# Patient Record
Sex: Female | Born: 1985 | Race: Black or African American | Hispanic: No | Smoking: Never smoker
Health system: Southern US, Community
[De-identification: ages and names within clinical notes are randomized; demographics above are authoritative.]

## PROBLEM LIST (undated history)

## (undated) ENCOUNTER — Inpatient Hospital Stay (HOSPITAL_COMMUNITY): Payer: Self-pay

## (undated) DIAGNOSIS — Z789 Other specified health status: Secondary | ICD-10-CM

## (undated) HISTORY — PX: NO PAST SURGERIES: SHX2092

---

## 2017-01-22 ENCOUNTER — Other Ambulatory Visit: Payer: Self-pay

## 2017-01-22 ENCOUNTER — Emergency Department (HOSPITAL_BASED_OUTPATIENT_CLINIC_OR_DEPARTMENT_OTHER): Payer: Medicaid Other

## 2017-01-22 ENCOUNTER — Inpatient Hospital Stay (EMERGENCY_DEPARTMENT_HOSPITAL)
Admission: AD | Admit: 2017-01-22 | Discharge: 2017-01-22 | Disposition: A | Discharge status: Home / Self Care | Payer: Medicaid Other | Source: Ambulatory Visit | Attending: Obstetrics & Gynecology | Admitting: Obstetrics & Gynecology

## 2017-01-22 ENCOUNTER — Emergency Department (HOSPITAL_BASED_OUTPATIENT_CLINIC_OR_DEPARTMENT_OTHER)
Admission: EM | Admit: 2017-01-22 | Discharge: 2017-01-22 | Disposition: A | Discharge status: Home / Self Care | Payer: Medicaid Other | Attending: Emergency Medicine | Admitting: Emergency Medicine

## 2017-01-22 ENCOUNTER — Encounter (HOSPITAL_COMMUNITY): Payer: Self-pay | Admitting: *Deleted

## 2017-01-22 ENCOUNTER — Encounter (HOSPITAL_BASED_OUTPATIENT_CLINIC_OR_DEPARTMENT_OTHER): Payer: Self-pay

## 2017-01-22 DIAGNOSIS — O26899 Other specified pregnancy related conditions, unspecified trimester: Secondary | ICD-10-CM

## 2017-01-22 DIAGNOSIS — Z79899 Other long term (current) drug therapy: Secondary | ICD-10-CM | POA: Insufficient documentation

## 2017-01-22 DIAGNOSIS — R109 Unspecified abdominal pain: Secondary | ICD-10-CM

## 2017-01-22 DIAGNOSIS — O26851 Spotting complicating pregnancy, first trimester: Secondary | ICD-10-CM | POA: Diagnosis not present

## 2017-01-22 DIAGNOSIS — Z3A09 9 weeks gestation of pregnancy: Secondary | ICD-10-CM | POA: Diagnosis not present

## 2017-01-22 DIAGNOSIS — O418X10 Other specified disorders of amniotic fluid and membranes, first trimester, not applicable or unspecified: Secondary | ICD-10-CM

## 2017-01-22 DIAGNOSIS — O9989 Other specified diseases and conditions complicating pregnancy, childbirth and the puerperium: Secondary | ICD-10-CM | POA: Diagnosis present

## 2017-01-22 DIAGNOSIS — O209 Hemorrhage in early pregnancy, unspecified: Secondary | ICD-10-CM

## 2017-01-22 DIAGNOSIS — O210 Mild hyperemesis gravidarum: Secondary | ICD-10-CM

## 2017-01-22 DIAGNOSIS — O200 Threatened abortion: Secondary | ICD-10-CM

## 2017-01-22 DIAGNOSIS — R102 Pelvic and perineal pain: Secondary | ICD-10-CM | POA: Diagnosis not present

## 2017-01-22 DIAGNOSIS — O468X1 Other antepartum hemorrhage, first trimester: Secondary | ICD-10-CM

## 2017-01-22 HISTORY — DX: Other specified health status: Z78.9

## 2017-01-22 LAB — BASIC METABOLIC PANEL
Anion gap: 11 (ref 5–15)
BUN: 7 mg/dL (ref 6–20)
CALCIUM: 9.2 mg/dL (ref 8.9–10.3)
CHLORIDE: 103 mmol/L (ref 101–111)
CO2: 21 mmol/L — AB (ref 22–32)
CREATININE: 0.59 mg/dL (ref 0.44–1.00)
GFR calc Af Amer: >60 mL/min (ref >60)
GFR calc non Af Amer: >60 mL/min (ref >60)
GLUCOSE: 83 mg/dL (ref 65–99)
Potassium: 3.2 mmol/L — ABNORMAL LOW (ref 3.5–5.1)
Sodium: 135 mmol/L (ref 135–145)

## 2017-01-22 LAB — ABO/RH: ABO/RH(D): A POS

## 2017-01-22 LAB — URINALYSIS, ROUTINE W REFLEX MICROSCOPIC
BILIRUBIN URINE: NEGATIVE
GLUCOSE, UA: NEGATIVE mg/dL
LEUKOCYTES UA: NEGATIVE
Nitrite: NEGATIVE
PH: 7.5 (ref 5.0–8.0)
Protein, ur: NEGATIVE mg/dL
SPECIFIC GRAVITY, URINE: 1.015 (ref 1.005–1.030)

## 2017-01-22 LAB — CBC
HEMATOCRIT: 39.1 % (ref 36.0–46.0)
HEMOGLOBIN: 13.3 g/dL (ref 12.0–15.0)
MCH: 32.4 pg (ref 26.0–34.0)
MCHC: 34 g/dL (ref 30.0–36.0)
MCV: 95.1 fL (ref 78.0–100.0)
Platelets: 160 10*3/uL (ref 150–400)
RBC: 4.11 MIL/uL (ref 3.87–5.11)
RDW: 11.8 % (ref 11.5–15.5)
WBC: 7.9 10*3/uL (ref 4.0–10.5)

## 2017-01-22 LAB — WET PREP, GENITAL
Sperm: NONE SEEN
Trich, Wet Prep: NONE SEEN
YEAST WET PREP: NONE SEEN

## 2017-01-22 LAB — URINALYSIS, MICROSCOPIC (REFLEX)

## 2017-01-22 LAB — HCG, QUANTITATIVE, PREGNANCY: hCG, Beta Chain, Quant, S: 255964 m[IU]/mL — ABNORMAL HIGH (ref <5)

## 2017-01-22 MED ORDER — PROMETHAZINE HCL 25 MG PO TABS: 25.0000 mg | ORAL_TABLET | Freq: Four times a day (QID) | ORAL | 0 refills | Status: DC | PRN

## 2017-01-22 NOTE — MAU Provider Note (Signed)
History     CSN: [id number]  Arrival date and time: 01/22/17 1741  Seen by provider at 1845     Chief Complaint  Patient presents with  . Vaginal Bleeding   HPI Samantha Myers 31 y.o. 3089w3d Comes to MAU today with vaginal bleeding and passing one large clot at home.  Was very worried.  Was [facility] and had a full early pregnancy workup with ultrasound.  A subchorionic hemorrhage was seen on ultrasound.  Reviewed ultrasound, labs and note from earlier today.  Blood type is A positive.   OB History    Gravida Para Term Preterm AB Living   3 2 2  0 0 2   SAB TAB Ectopic Multiple Live Births   0 0 0 0 2      Past Medical History:  Diagnosis Date  . Medical history non-contributory     Past Surgical History:  Procedure Laterality Date  . NO PAST SURGERIES      History reviewed. No pertinent family history.  Social History   Tobacco Use  . Smoking status: Never Smoker  . Smokeless tobacco: Never Used  . Tobacco comment: tried cigarettes a long time ago, never smoked as a habit  Substance Use Topics  . Alcohol use: No    Frequency: Never  . Drug use: No    Allergies:  Allergies  Allergen Reactions  . Pertussis Vaccines     Medications Prior to Admission  Medication Sig Dispense Refill Last Dose  . Prenatal Multivit-Min-Fe-FA (PRENATAL VITAMINS PO) Take by mouth.       Review of Systems  Constitutional: Negative for fever.  Gastrointestinal: Positive for nausea and vomiting. Negative for abdominal pain.  Genitourinary: Positive for vaginal bleeding. Negative for dysuria and vaginal discharge.   Physical Exam   Blood pressure 123/74, pulse 99, temperature 98.8 F (37.1 C), temperature source Oral, resp. rate 16, last menstrual period 11/25/2016.  Physical Exam  Nursing note and vitals reviewed. Constitutional: She is oriented to person, place, and time. She appears well-developed and well-nourished.  HENT:  Head: Normocephalic.   Eyes: EOM are normal.  Neck: Neck supple.  GI: Soft. There is no tenderness. There is no rebound and no guarding.  Informal ultrasound at bedside by Estanislado SpireE. Lawrence, NP and baby visualized with flickering FHT - based on position of baby was not able to count FHT but visually was normal.  Musculoskeletal: Normal range of motion.  Neurological: She is alert and oriented to person, place, and time.  Skin: Skin is warm and dry.  Psychiatric: She has a normal mood and affect.    MAU Course  Procedures Bedside ultrasound - FHT identified - likely bleeding is coming from the earlier identified subchronic hemorrhage.  MDM Discussed with client that she may have additional vaginal bleeding.  Reviewed instructions for morning sickness and prescribed Phenergan for her to take at home.  Reviewed side effects of drowsiness.  Advised she can take 1/2 tablet.  Also gave instructions for using vitamin B6 and Unisom for nausea/  Client confirms no alcohol use, no smoking, and no medication or street drug use..  Assessment and Plan  Vaginal bleeding in first trimester - likely due to subchorionic hemorrhage but is possible from other causes - IUP identified on ultrasound. Morning sickness  Plan Get your medication from your pharmacy for nausea. Return if you have continued worsening bleeding with abdominal pain. Begin prenatal care - has a plan to see a provider  in January.  Has applied for Medicaid but does not yet have her card.  Samantha Myers 01/22/2017, 6:52 PM

## 2017-01-22 NOTE — ED Notes (Signed)
Patient has a small red blood clot appearing on a tissue in the trash can, notified RN Adrienne.

## 2017-01-22 NOTE — ED Provider Notes (Signed)
[facility] EMERGENCY DEPARTMENT Provider Note   CSN: [id number] Arrival date & time: 01/22/17  1058     History   Chief Complaint Chief Complaint  Patient presents with  . Vaginal Bleeding    HPI Samantha Myers is a 31 y.o. female who is G3P2002.  She is about [redacted] weeks pregnant at this time.  The patient had onset of spontaneous vaginal bleeding with clots and cramping about 5 minutes prior to arrival in the emergency department.  She denies fevers or chills.  She has no history of miscarriages.  HPI  History reviewed. No pertinent past medical history.  There are no active problems to display for this patient.   History reviewed. No pertinent surgical history.  OB History    Gravida Para Term Preterm AB Living   1             SAB TAB Ectopic Multiple Live Births                   Home Medications    Prior to Admission medications   Medication Sig Start Date End Date Taking? Authorizing Provider  Prenatal Multivit-Min-Fe-FA (PRENATAL VITAMINS PO) Take by mouth.   Yes [provider]    Family History No family history on file.  Social History Social History   Tobacco Use  . Smoking status: Never Smoker  . Smokeless tobacco: Never Used  Substance Use Topics  . Alcohol use: No    Frequency: Never  . Drug use: No     Allergies   Pertussis vaccines   Review of Systems Review of Systems  Ten systems reviewed and are negative for acute change, except as noted in the HPI.   Physical Exam Updated Vital Signs BP (!) 146/87 (BP Location: Right Arm)   Pulse 89   Temp 98.3 F (36.8 C) (Oral)   Resp 18   Ht 5\' 8"  (1.727 m)   Wt 75.8 kg (167 lb)   SpO2 100%   BMI 25.39 kg/m   Physical Exam  Physical Exam  Nursing note and vitals reviewed. Constitutional: She is oriented to person, place, and time. She appears well-developed and well-nourished. No distress.  HENT:  Head: Normocephalic and atraumatic.  Eyes: Conjunctivae  normal and EOM are normal. Pupils are equal, round, and reactive to light. No scleral icterus.  Neck: Normal range of motion.  Cardiovascular: Normal rate, regular rhythm and normal heart sounds.  Exam reveals no gallop and no friction rub.   No murmur heard. Pulmonary/Chest: Effort normal and breath sounds normal. No respiratory distress.  Abdominal: Soft. Bowel sounds are normal. She exhibits no distension and no mass. There is no tenderness. There is no guarding.  Neurological: She is alert and oriented to person, place, and time.  GU: Normal external female genitalia.  Blood noted in the vaginal vault.  Cervical mucus noted and blood from the cervical office without clotting.  Diffuse moderate tenderness on examination. Skin: Skin is warm and dry. She is not diaphoretic.    ED Treatments / Results  Labs (all labs ordered are listed, but only abnormal results are displayed) Labs Reviewed  BASIC METABOLIC PANEL - Abnormal; Notable for the following components:      Result Value   Potassium 3.2 (*)    CO2 21 (*)    All other components within normal limits  URINALYSIS, ROUTINE W REFLEX MICROSCOPIC - Abnormal; Notable for the following components:   Hgb urine dipstick LARGE (*)  Ketones, ur >80 (*)    All other components within normal limits  URINALYSIS, MICROSCOPIC (REFLEX) - Abnormal; Notable for the following components:   Bacteria, UA RARE (*)    Squamous Epithelial / LPF 0-5 (*)    All other components within normal limits  WET PREP, GENITAL  CBC  HCG, QUANTITATIVE, PREGNANCY  ABO/RH  GC/CHLAMYDIA PROBE AMP ([place]) NOT AT [facility]    EKG  EKG Interpretation None       Radiology No results found.  Procedures Procedures (including critical care time)  Medications Ordered in ED Medications - No data to display   Initial Impression / Assessment and Plan / ED Course  I have reviewed the triage vital signs and the nursing notes.  Pertinent labs & imaging  results that were available during my care of the patient were reviewed by me and considered in my medical decision making (see chart for details).      single IUP visualized in the uterus. Her HCG is tracking with expected EGA. The patient also has subchorionic hemorrhage.  Will d/c with dx of threatened miscarriage and close ob follow up .   Final Clinical Impressions(s) / ED Diagnoses   Final diagnoses:  Subchorionic hematoma in first trimester, single or unspecified fetus  Threatened miscarriage    ED Discharge Orders    None       Arthor CaptainHarris, Emanuel Dowson, PA-C 01/22/17 1623    Raeford RazorKohut, Stephen, MD 01/23/17 [phone]

## 2017-01-22 NOTE — ED Triage Notes (Signed)
Pt states she is [redacted] weeks pregnant by US-vaginal bleeding started just PTA-NAD-presents to triage in w/c

## 2017-01-22 NOTE — Discharge Instructions (Signed)
Get help right away if: You have severe cramps in your stomach, back, abdomen, or pelvis. You have a fever. You pass large clots or tissue. Save any tissue for your health care provider to look at. Your bleeding increases or you become lightheaded, feel weak, or have fainting episodes.

## 2017-01-22 NOTE — Progress Notes (Signed)
Informal bedside ultrasound performed. IUP @ 8w with cardiac activity.   Judeth HornLawrence, Luz Mares, NP

## 2017-01-22 NOTE — MAU Note (Signed)
Pt seen earlier today @ [facility], [facility]  Pt states she passed a large clot, was instructed to come here with further issues.  Denies pain.

## 2017-01-22 NOTE — Discharge Instructions (Signed)
Get your medicine at your pharmacy. Use Vitamin B6 50 mg tablets and Unisom (Plain) at bedtime. Ask your provider for Diclegis when you get your Medicaid card.

## 2017-01-23 LAB — GC/CHLAMYDIA PROBE AMP (~~LOC~~) NOT AT ARMC
CHLAMYDIA, DNA PROBE: NEGATIVE
NEISSERIA GONORRHEA: NEGATIVE

## 2017-02-10 ENCOUNTER — Ambulatory Visit (INDEPENDENT_AMBULATORY_CARE_PROVIDER_SITE_OTHER): Payer: Medicaid Other | Admitting: Obstetrics and Gynecology

## 2017-02-10 ENCOUNTER — Encounter: Payer: Self-pay | Admitting: Obstetrics and Gynecology

## 2017-02-10 ENCOUNTER — Other Ambulatory Visit (HOSPITAL_COMMUNITY)
Admission: RE | Admit: 2017-02-10 | Discharge: 2017-02-10 | Disposition: A | Discharge status: Home / Self Care | Payer: Medicaid Other | Source: Ambulatory Visit | Attending: Obstetrics and Gynecology | Admitting: Obstetrics and Gynecology

## 2017-02-10 VITALS — BP 119/72 | HR 77 | Wt 174.0 lb

## 2017-02-10 DIAGNOSIS — Z3481 Encounter for supervision of other normal pregnancy, first trimester: Secondary | ICD-10-CM | POA: Diagnosis not present

## 2017-02-10 DIAGNOSIS — N83292 Other ovarian cyst, left side: Secondary | ICD-10-CM

## 2017-02-10 DIAGNOSIS — Z3490 Encounter for supervision of normal pregnancy, unspecified, unspecified trimester: Secondary | ICD-10-CM

## 2017-02-10 DIAGNOSIS — Z3401 Encounter for supervision of normal first pregnancy, first trimester: Secondary | ICD-10-CM

## 2017-02-10 DIAGNOSIS — O200 Threatened abortion: Secondary | ICD-10-CM

## 2017-02-10 DIAGNOSIS — Z3493 Encounter for supervision of normal pregnancy, unspecified, third trimester: Secondary | ICD-10-CM | POA: Insufficient documentation

## 2017-02-10 NOTE — Progress Notes (Addendum)
New OB Note  02/10/2017   [facility]  Chief Complaint: NOB  Transfer of Care Patient: no  History of Present Illness: Ms. Samantha Myers is a 32 y.o. [id number] @ 12/2 weeks (EDC 7/27, based on 8wk u/s). Patient's last menstrual period was 11/25/2016 (exact date).  Preg complicated by has Threatened abortion in first trimester and Supervision of normal pregnancy in first trimester on their problem list.   Any events prior to today's visit: yes:  MAU and ED visits for VB Her periods were: qmonth and regular She was using no method when she conceived.   Just having spotting currently, last episode this morning  ROS: A 12-point review of systems was performed and negative, except as stated in the above HPI.  OBGYN History: As per HPI. OB History  Gravida Para Term Preterm AB Living  3 2 2  0 0 2  SAB TAB Ectopic Multiple Live Births  0 0 0 0 2    # Outcome Date GA Lbr Len/2nd Weight Sex Delivery Anes PTL Lv  3 Current           2 Term      Vag-Spont     1 Term      Vag-Spont       Obstetric Comments  Last child in 2007. Largest prior 8.5 lbs    Any issues with any prior pregnancies: no Prior children are healthy, doing well, and without any problems or issues: yes History of pap smears: Yes. Last pap smear 2017 and results were NILM/HPV neg   Past Medical History: Past Medical History:  Diagnosis Date  . Medical history non-contributory     Past Surgical History: Past Surgical History:  Procedure Laterality Date  . NO PAST SURGERIES      Family History:  Family History  Problem Relation Age of Onset  . Cancer Maternal Grandmother        breast  . Diabetes Maternal Grandfather   . Hypertension Paternal Grandmother   . Hypertension Paternal Grandfather   . Stroke Neg Hx    She denies any history of mental retardation, birth defects or genetic disorders in her or the FOB's history  Social History:  Social History   Socioeconomic History  .  Marital status: Single    Spouse name: Not on file  . Number of children: Not on file  . Years of education: Not on file  . Highest education level: Not on file  Social Needs  . Financial resource strain: Not on file  . Food insecurity - worry: Not on file  . Food insecurity - inability: Not on file  . Transportation needs - medical: Not on file  . Transportation needs - non-medical: Not on file  Occupational History  . Not on file  Tobacco Use  . Smoking status: Never Smoker  . Smokeless tobacco: Never Used  . Tobacco comment: tried cigarettes a long time ago, never smoked as a habit  Substance and Sexual Activity  . Alcohol use: No    Frequency: Never  . Drug use: No  . Sexual activity: Yes    Partners: Male    Birth control/protection: None  Other Topics Concern  . Not on file  Social History Narrative  . Not on file    Allergy: Allergies  Allergen Reactions  . Pertussis Vaccines     Health Maintenance:  Mammogram Up to Date: yes, 2017 bi rads 1  Current Outpatient Medications: PNV  Physical Exam:  BP 119/72   Pulse 77   Wt 174 lb (78.9 kg)   LMP 11/25/2016 (Exact Date)   BMI 26.46 kg/m  Body mass index is 26.46 kg/m. Contractions: Not present Vag. Bleeding: Scant. Fundal height: not applicable FHTs: 150s  General appearance: Well nourished, well developed female in no acute distress.  Neck:  Supple, normal appearance, and no thyromegaly  Cardiovascular: S1, S2 normal, no murmur, rub or gallop, regular rate and rhythm Respiratory:  Clear to auscultation bilateral. Normal respiratory effort Abdomen: positive bowel sounds and no masses, hernias; diffusely non tender to palpation, non distended Breasts: breasts appear normal, no suspicious masses, no skin or nipple changes or axillary nodes, and negative palpation. Neuro/Psych:  Normal mood and affect.  Skin:  Warm and dry.  Lymphatic:  No inguinal lymphadenopathy.   Pelvic exam: is not limited by  body habitus EGBUS: within normal limits, Vagina: within normal limits and with scant blood tinged mucus in the vault,  Cervix: normal appearing cervix with scant blood tinged mucus at the os, no active bleeding, closed/long/high, Uterus:  enlarged, c/w 12 week size, and Adnexa:  normal adnexa and no mass, fullness, tenderness  Laboratory: Negative December 2018 wet prep, cbc, gc/ct A POS  Imaging:  01/12/17 OB TRANSVAGINAL ULTRASOUND:  TECHNIQUE: Grayscale and Doppler images obtained using a transvaginal approach.   PROVIDED CLINICAL INFORMATION: vaginal bleed pregnant [redacted] weeks approximately ADDITIONAL CLINICAL INFORMATION: None available  COMPARISON: None  INTERPRETATION:   The uterus is normal in size and echotexture.  Number gestational sacs: 1 Number of fetuses: 1 Fetal heart rate: 141 bpm  Gestational sac size: 3.04 cm, consistent with a sonographic gestational age of [redacted] weeks and 1 day.  Crown rump length: 1.13 cm, consistent with a sonographic gestational age of [redacted] weeks and 2 days. Fetal size and anatomy: Unremarkable early gestation. A yolk sac is present.  Right ovary is normal in size and appearance. Left ovary is normal in size and appearance. It contains a mildly complex cyst with internal nonvascular septation measuring 3.5 x 2.5 x 3.8 cm.  There is no significant free fluid.  IMPRESSION: Single live intrauterine gestation with average sonographic gestational age of [redacted] weeks and 5 days.  Mildly complex left ovarian cyst.  Electronically Signed by: Chase Caller, MD   Assessment: pt stable  Plan: 1. Prenatal care, antepartum Routine care. Pt amenable to genetics. D/w pt re: edc change based on [facility] u/s - Obstetric Panel, Including HIV - GC/Chlamydia probe amp ([place])not at [facility] MFM Fetal Nuchal Translucency; Future - Hemoglobinopathy Evaluation - Cystic fibrosis gene test - SMN1 Copy Number Analysis  2. Threatened abortion in first  trimester Rh pos. Likely due to [facility]. Pt just endorses spotting. Recommend if having only spotting to let us know and come in for [facility] check qwk. ER precautions given. Pt amenable to genetic screening   Problem list reviewed and updated.  Follow up in 3 weeks.  The nature of [facility] with multiple MDs and other Advanced Practice Providers was explained to patient; also emphasized that residents, students are part of our team.  >50% of 20 min visit spent on counseling and coordination of care.     Cornelia Copa MD Attending Center for [facility])

## 2017-02-10 NOTE — Progress Notes (Signed)
Patient reports that she was seen at the hospital and has subchronic. Armandina StammerJennifer Howard RNBSN DATING AND VIABILITY SONOGRAM   Samantha CoryWhitney Fisk is a 32 y.o. year old 163P2002 with LMP Patient's last menstrual period was 11/25/2016 (exact date). which would correlate to  4632w2d weeks gestation.  She has regular menstrual cycles.   She is here today for a confirmatory initial sonogram.    GESTATION: SINGLETON   FETAL ACTIVITY:          Heart rate         158          The fetus is active.    GESTATIONAL AGE AND  BIOMETRICS:  Gestational criteria: Estimated Date of Delivery: 08/23/17 by early ultrasound now at 6332w2d  Previous Scans:2      CROWN RUMP LENGTH                   11.6 weeks                                                                               AVERAGE EGA(BY THIS SCAN):  11.6 weeks  WORKING EDD( early ultrasound ):  08-23-17     Armandina StammerJennifer Howard 02/10/2017 10:47 AM

## 2017-02-11 ENCOUNTER — Encounter (HOSPITAL_COMMUNITY): Payer: Self-pay | Admitting: Obstetrics and Gynecology

## 2017-02-11 LAB — OBSTETRIC PANEL, INCLUDING HIV
Antibody Screen: NEGATIVE
BASOS ABS: 0 10*3/uL (ref 0.0–0.2)
Basos: 0 %
EOS (ABSOLUTE): 0.1 10*3/uL (ref 0.0–0.4)
EOS: 1 %
HEMOGLOBIN: 12 g/dL (ref 11.1–15.9)
HEP B S AG: NEGATIVE
HIV Screen 4th Generation wRfx: NONREACTIVE
Hematocrit: 36.9 % (ref 34.0–46.6)
IMMATURE GRANS (ABS): 0 10*3/uL (ref 0.0–0.1)
IMMATURE GRANULOCYTES: 0 %
LYMPHS: 22 %
Lymphocytes Absolute: 1.7 10*3/uL (ref 0.7–3.1)
MCH: 31.6 pg (ref 26.6–33.0)
MCHC: 32.5 g/dL (ref 31.5–35.7)
MCV: 97 fL (ref 79–97)
MONOCYTES: 7 %
Monocytes Absolute: 0.6 10*3/uL (ref 0.1–0.9)
NEUTROS PCT: 70 %
Neutrophils Absolute: 5.6 10*3/uL (ref 1.4–7.0)
PLATELETS: 194 10*3/uL (ref 150–379)
RBC: 3.8 x10E6/uL (ref 3.77–5.28)
RDW: 13 % (ref 12.3–15.4)
RH TYPE: POSITIVE
RPR: NONREACTIVE
Rubella Antibodies, IGG: 1 index (ref >0.99)
WBC: 7.9 10*3/uL (ref 3.4–10.8)

## 2017-02-11 LAB — GC/CHLAMYDIA PROBE AMP (~~LOC~~) NOT AT ARMC
Chlamydia: NEGATIVE
Neisseria Gonorrhea: NEGATIVE

## 2017-02-17 ENCOUNTER — Other Ambulatory Visit (INDEPENDENT_AMBULATORY_CARE_PROVIDER_SITE_OTHER): Payer: Medicaid Other

## 2017-02-17 DIAGNOSIS — O26851 Spotting complicating pregnancy, first trimester: Secondary | ICD-10-CM

## 2017-02-17 LAB — HEMOGLOBINOPATHY EVALUATION
Ferritin: 89 ng/mL (ref 15–150)
HEMATOCRIT: 37.1 % (ref 34.0–46.6)
HEMOGLOBIN: 12.3 g/dL (ref 11.1–15.9)
HGB C: 0 %
HGB S: 0 %
HGB VARIANT: 0 %
Hgb A2 Quant: 2.6 % (ref 1.8–3.2)
Hgb A: 97.4 % (ref 96.4–98.8)
Hgb F Quant: 0 % (ref 0.0–2.0)
Hgb Solubility: NEGATIVE
MCH: 32.1 pg (ref 26.6–33.0)
MCHC: 33.2 g/dL (ref 31.5–35.7)
MCV: 97 fL (ref 79–97)
Platelets: 198 10*3/uL (ref 150–379)
RBC: 3.83 x10E6/uL (ref 3.77–5.28)
RDW: 13 % (ref 12.3–15.4)
WBC: 7.8 10*3/uL (ref 3.4–10.8)

## 2017-02-17 LAB — CYSTIC FIBROSIS GENE TEST

## 2017-02-17 LAB — SMN1 COPY NUMBER ANALYSIS (SMA CARRIER SCREENING)

## 2017-02-17 NOTE — Progress Notes (Signed)
Patient presents for fetal check per Dr. Vergie LivingPickens.  Patient states that she has still had a "little spotting;maybe three days of the week".  Bedside ultrasound performed for fetal heart. Fetal heart rate 150 bpm and fetus is active. Reassuring to the patient. Patient is scheduled for nuchal translucency test tomorrow 02-18-17. Armandina StammerJennifer Nevyn Bossman RNBSN

## 2017-02-18 ENCOUNTER — Ambulatory Visit (HOSPITAL_COMMUNITY)
Admission: RE | Admit: 2017-02-18 | Discharge: 2017-02-18 | Disposition: A | Discharge status: Home / Self Care | Payer: Medicaid Other | Source: Ambulatory Visit | Attending: Obstetrics and Gynecology | Admitting: Obstetrics and Gynecology

## 2017-02-18 ENCOUNTER — Encounter (HOSPITAL_COMMUNITY): Payer: Self-pay

## 2017-02-18 ENCOUNTER — Other Ambulatory Visit: Payer: Self-pay | Admitting: Obstetrics and Gynecology

## 2017-02-18 DIAGNOSIS — Z3A13 13 weeks gestation of pregnancy: Secondary | ICD-10-CM | POA: Insufficient documentation

## 2017-02-18 DIAGNOSIS — Z3682 Encounter for antenatal screening for nuchal translucency: Secondary | ICD-10-CM | POA: Diagnosis present

## 2017-02-18 DIAGNOSIS — Z3490 Encounter for supervision of normal pregnancy, unspecified, unspecified trimester: Secondary | ICD-10-CM

## 2017-02-28 ENCOUNTER — Other Ambulatory Visit (HOSPITAL_COMMUNITY): Payer: Self-pay

## 2017-03-04 ENCOUNTER — Ambulatory Visit (INDEPENDENT_AMBULATORY_CARE_PROVIDER_SITE_OTHER): Payer: Medicaid Other | Admitting: Advanced Practice Midwife

## 2017-03-04 VITALS — BP 123/68 | HR 91 | Wt 177.0 lb

## 2017-03-04 DIAGNOSIS — Z363 Encounter for antenatal screening for malformations: Secondary | ICD-10-CM

## 2017-03-04 DIAGNOSIS — Z3481 Encounter for supervision of other normal pregnancy, first trimester: Secondary | ICD-10-CM

## 2017-03-04 DIAGNOSIS — Z3A18 18 weeks gestation of pregnancy: Secondary | ICD-10-CM

## 2017-03-04 DIAGNOSIS — Z3490 Encounter for supervision of normal pregnancy, unspecified, unspecified trimester: Secondary | ICD-10-CM

## 2017-03-04 NOTE — Patient Instructions (Signed)
Pregnancy and Influenza Influenza, also called the flu, is an infection of the respiratory tract. If you are pregnant, you are more likely to catch the flu. You are also more likely to have a more serious case of the flu. This is because pregnancy lowers your body's ability to fight off infections (it weakens your immune system). It also puts additional stress on your heart and lungs, which makes you more likely to have complications. Having a bad case of the flu, especially with a high fever, can be dangerous for your developing baby. It can cause you to go into early labor. How do people get the flu? The flu is caused by the influenza virus. This virus is common every year in the fall and winter. It spreads when virus particles get passed from person to person. You can get the virus if you are near a sick person who is coughing or sneezing. You can also get the virus if you touch something that has the virus on it and then touch your face. How can I protect myself against the flu?  Get a flu shot. The best way to prevent the flu is to get a flu shot before flu season starts. The flu shot is not dangerous for your developing baby. It may even help protect your baby from the flu for up to 6 months after birth. The flu shot is one type of flu vaccine. Another type is a nasal spray vaccine. Do not get the nasal spray vaccine. It is not approved for pregnancy.  Do not come in close contact with sick people.  Do not share food, drinks, or utensils with other people.  Wash your hands often. Use hand sanitizer when soap and water are not available. What should I do if I have flu symptoms? If you have any flu symptoms, call your health care provider right away. Flu symptoms include:  Fever or chills.  Muscle aches.  Headache.  Sore throat.  Nasal congestion.  Cough.  Feeling tired.  Loss of appetite.  Vomiting.  Diarrhea.  You may be able to take an antiviral medicine to keep the flu  from becoming severe and to shorten how long it lasts. What should I do at home if I am diagnosed with the flu?  Do not take any medicine, including cold or flu medicine, unless directed by your health care provider.  If you take antiviral medicine, make sure you finish it even if you start to feel better.  Drink enough fluid to keep your urine clear or pale yellow.  Get plenty of rest. When would I seek immediate medical care if I have the flu?  You have trouble breathing.  You have chest pain.  You begin to have labor pains.  You have a high fever that does not go down after you take medicine.  You do not feel your baby move.  You have diarrhea or vomiting that will not go away. This information is not intended to replace advice given to you by your health care provider. Make sure you discuss any questions you have with your health care provider. Document Released: 11/17/2007 Document Revised: 06/22/2015 Document Reviewed: 12/11/2012 Elsevier Interactive Patient Education  2017 [employer]   Second Trimester of Pregnancy The second trimester is from week 13 through week 28, month 4 through 6. This is often the time in pregnancy that you feel your best. Often times, morning sickness has lessened or quit. You may have more energy, and you  may get hungry more often. Your unborn baby (fetus) is growing rapidly. At the end of the sixth month, he or she is about 9 inches long and weighs about 1 pounds. You will likely feel the baby move (quickening) between 18 and 20 weeks of pregnancy. Follow these instructions at home:  Avoid all smoking, herbs, and alcohol. Avoid drugs not approved by your doctor.  Do not use any tobacco products, including cigarettes, chewing tobacco, and electronic cigarettes. If you need help quitting, ask your doctor. You may get counseling or other support to help you quit.  Only take medicine as told by your doctor. Some medicines are safe and some are  not during pregnancy.  Exercise only as told by your doctor. Stop exercising if you start having cramps.  Eat regular, healthy meals.  Wear a good support bra if your breasts are tender.  Do not use hot tubs, steam rooms, or saunas.  Wear your seat belt when driving.  Avoid raw meat, uncooked cheese, and liter boxes and soil used by cats.  Take your prenatal vitamins.  Take 1500-2000 milligrams of calcium daily starting at the 20th week of pregnancy until you deliver your baby.  Try taking medicine that helps you poop (stool softener) as needed, and if your doctor approves. Eat more fiber by eating fresh fruit, vegetables, and whole grains. Drink enough fluids to keep your pee (urine) clear or pale yellow.  Take warm water baths (sitz baths) to soothe pain or discomfort caused by hemorrhoids. Use hemorrhoid cream if your doctor approves.  If you have puffy, bulging veins (varicose veins), wear support hose. Raise (elevate) your feet for 15 minutes, 3-4 times a day. Limit salt in your diet.  Avoid heavy lifting, wear low heals, and sit up straight.  Rest with your legs raised if you have leg cramps or low back pain.  Visit your dentist if you have not gone during your pregnancy. Use a soft toothbrush to brush your teeth. Be gentle when you floss.  You can have sex (intercourse) unless your doctor tells you not to.  Go to your doctor visits. Get help if:  You feel dizzy.  You have mild cramps or pressure in your lower belly (abdomen).  You have a nagging pain in your belly area.  You continue to feel sick to your stomach (nauseous), throw up (vomit), or have watery poop (diarrhea).  You have bad smelling fluid coming from your vagina.  You have pain with peeing (urination). Get help right away if:  You have a fever.  You are leaking fluid from your vagina.  You have spotting or bleeding from your vagina.  You have severe belly cramping or pain.  You lose or gain  weight rapidly.  You have trouble catching your breath and have chest pain.  You notice sudden or extreme puffiness (swelling) of your face, hands, ankles, feet, or legs.  You have not felt the baby move in over an hour.  You have severe headaches that do not go away with medicine.  You have vision changes. This information is not intended to replace advice given to you by your health care provider. Make sure you discuss any questions you have with your health care provider. Document Released: 04/10/2009 Document Revised: 06/22/2015 Document Reviewed: 03/17/2012 Elsevier Interactive Patient Education  2017 [employer].

## 2017-03-04 NOTE — Progress Notes (Signed)
   PRENATAL VISIT NOTE  Subjective:  Samantha Myers is a 32 y.o. G3P2002 at 5152w3d being seen today for ongoing prenatal care.  She is currently monitored for the following issues for this low-risk pregnancy and has Threatened abortion in first trimester and Supervision of normal pregnancy in first trimester on their problem list.  Patient reports no complaints and no more bleeding.  Contractions: Not present. Vag. Bleeding: None.   . Denies leaking of fluid.   The following portions of the patient's history were reviewed and updated as appropriate: allergies, current medications, past family history, past medical history, past social history, past surgical history and problem list. Problem list updated.  Objective:   Vitals:   03/04/17 0941  BP: 123/68  Pulse: 91  Weight: 177 lb (80.3 kg)    Fetal Status: Fetal Heart Rate (bpm): 145         General:  Alert, oriented and cooperative. Patient is in no acute distress.  Skin: Skin is warm and dry. No rash noted.   Cardiovascular: Normal heart rate noted  Respiratory: Normal respiratory effort, no problems with respiration noted  Abdomen: Soft, gravid, appropriate for gestational age.  Pain/Pressure: Absent     Pelvic: Cervical exam deferred        Extremities: Normal range of motion.  Edema: None  Mental Status:  Normal mood and affect. Normal behavior. Normal judgment and thought content.   Assessment and Plan:  Pregnancy: G3P2002 at 352w3d  1. Prenatal care, antepartum  - Refused Flu vaccine. Handout given.  - US MFM OB DETAIL +14 WK; Future  2. Encounter for supervision of other normal pregnancy in first trimester  - US MFM OB COMP + 14 WK; Future  3. Encounter for antenatal screening for malformation  - US MFM OB COMP + 14 WK; Future  4. [redacted] weeks gestation of pregnancy  - US MFM OB COMP + 14 WK; Future  Preterm labor symptoms and general obstetric precautions including but not limited to vaginal bleeding,  contractions, leaking of fluid and fetal movement were reviewed in detail with the patient. Please refer to After Visit Summary for other counseling recommendations.  Return in about 4 weeks (around 04/01/2017) for ROB.   Dorathy KinsmanVirginia Fuquan Wilson, CNM

## 2017-03-10 ENCOUNTER — Other Ambulatory Visit: Payer: Self-pay

## 2017-03-10 ENCOUNTER — Inpatient Hospital Stay (HOSPITAL_COMMUNITY)
Admission: AD | Admit: 2017-03-10 | Discharge: 2017-03-10 | Disposition: A | Discharge status: Home / Self Care | Payer: Medicaid Other | Source: Ambulatory Visit | Attending: Obstetrics & Gynecology | Admitting: Obstetrics & Gynecology

## 2017-03-10 ENCOUNTER — Telehealth: Payer: Self-pay

## 2017-03-10 ENCOUNTER — Encounter (HOSPITAL_COMMUNITY): Payer: Self-pay | Admitting: *Deleted

## 2017-03-10 DIAGNOSIS — Z3A16 16 weeks gestation of pregnancy: Secondary | ICD-10-CM | POA: Diagnosis not present

## 2017-03-10 DIAGNOSIS — Z79899 Other long term (current) drug therapy: Secondary | ICD-10-CM | POA: Diagnosis not present

## 2017-03-10 DIAGNOSIS — Z833 Family history of diabetes mellitus: Secondary | ICD-10-CM | POA: Insufficient documentation

## 2017-03-10 DIAGNOSIS — R1031 Right lower quadrant pain: Secondary | ICD-10-CM | POA: Diagnosis not present

## 2017-03-10 DIAGNOSIS — Z8249 Family history of ischemic heart disease and other diseases of the circulatory system: Secondary | ICD-10-CM | POA: Insufficient documentation

## 2017-03-10 DIAGNOSIS — R102 Pelvic and perineal pain: Secondary | ICD-10-CM

## 2017-03-10 DIAGNOSIS — O26892 Other specified pregnancy related conditions, second trimester: Secondary | ICD-10-CM | POA: Insufficient documentation

## 2017-03-10 DIAGNOSIS — O219 Vomiting of pregnancy, unspecified: Secondary | ICD-10-CM | POA: Diagnosis not present

## 2017-03-10 DIAGNOSIS — R109 Unspecified abdominal pain: Secondary | ICD-10-CM

## 2017-03-10 DIAGNOSIS — Z888 Allergy status to other drugs, medicaments and biological substances status: Secondary | ICD-10-CM | POA: Diagnosis not present

## 2017-03-10 DIAGNOSIS — O26899 Other specified pregnancy related conditions, unspecified trimester: Secondary | ICD-10-CM

## 2017-03-10 DIAGNOSIS — Z803 Family history of malignant neoplasm of breast: Secondary | ICD-10-CM | POA: Diagnosis not present

## 2017-03-10 DIAGNOSIS — O212 Late vomiting of pregnancy: Secondary | ICD-10-CM | POA: Diagnosis not present

## 2017-03-10 DIAGNOSIS — R1030 Lower abdominal pain, unspecified: Secondary | ICD-10-CM | POA: Insufficient documentation

## 2017-03-10 LAB — URINALYSIS, ROUTINE W REFLEX MICROSCOPIC
Bacteria, UA: NONE SEEN
Bilirubin Urine: NEGATIVE
GLUCOSE, UA: NEGATIVE mg/dL
KETONES UR: 80 mg/dL — AB
Leukocytes, UA: NEGATIVE
NITRITE: NEGATIVE
PH: 6 (ref 5.0–8.0)
Protein, ur: 30 mg/dL — AB
SPECIFIC GRAVITY, URINE: 1.023 (ref 1.005–1.030)

## 2017-03-10 LAB — CBC WITH DIFFERENTIAL/PLATELET
BASOS PCT: 0 %
Basophils Absolute: 0 10*3/uL (ref 0.0–0.1)
Eosinophils Absolute: 0.1 10*3/uL (ref 0.0–0.7)
Eosinophils Relative: 1 %
HEMATOCRIT: 34.3 % — AB (ref 36.0–46.0)
HEMOGLOBIN: 11.8 g/dL — AB (ref 12.0–15.0)
LYMPHS ABS: 2.1 10*3/uL (ref 0.7–4.0)
LYMPHS PCT: 24 %
MCH: 32.4 pg (ref 26.0–34.0)
MCHC: 34.4 g/dL (ref 30.0–36.0)
MCV: 94.2 fL (ref 78.0–100.0)
MONOS PCT: 7 %
Monocytes Absolute: 0.6 10*3/uL (ref 0.1–1.0)
NEUTROS ABS: 5.7 10*3/uL (ref 1.7–7.7)
Neutrophils Relative %: 68 %
Platelets: 143 10*3/uL — ABNORMAL LOW (ref 150–400)
RBC: 3.64 MIL/uL — ABNORMAL LOW (ref 3.87–5.11)
RDW: 12.7 % (ref 11.5–15.5)
WBC: 8.4 10*3/uL (ref 4.0–10.5)

## 2017-03-10 MED ORDER — LACTATED RINGERS IV BOLUS (SEPSIS)
1000.0000 mL | Freq: Once | INTRAVENOUS | Status: AC
  Administered 2017-03-10: 1000 mL via INTRAVENOUS

## 2017-03-10 MED ORDER — ONDANSETRON HCL 4 MG PO TABS: 4.0000 mg | ORAL_TABLET | Freq: Three times a day (TID) | ORAL | 2 refills | Status: DC | PRN

## 2017-03-10 MED ORDER — DEXTROSE 5 % IN LACTATED RINGERS IV BOLUS
1000.0000 mL | Freq: Once | INTRAVENOUS | Status: AC
  Administered 2017-03-10: 1000 mL via INTRAVENOUS

## 2017-03-10 MED ORDER — FAMOTIDINE IN NACL 20-0.9 MG/50ML-% IV SOLN
20.0000 mg | Freq: Once | INTRAVENOUS | Status: AC
  Administered 2017-03-10: 20 mg via INTRAVENOUS
  Filled 2017-03-10: qty 50

## 2017-03-10 MED ORDER — SODIUM CHLORIDE 0.9 % IV SOLN
8.0000 mg | Freq: Once | INTRAVENOUS | Status: AC
  Administered 2017-03-10: 8 mg via INTRAVENOUS
  Filled 2017-03-10: qty 4

## 2017-03-10 NOTE — MAU Provider Note (Signed)
History     CSN: [id number]  Arrival date and time: 03/10/17 [id number]   First Provider Initiated Contact with Patient 03/10/17 1937      Chief Complaint  Patient presents with  . Abdominal Pain   HPI   [name] is a 32 y.o. female 723P2002 @ 8944w2d here with bilateral lower abdominal pain. The pain started yesterday. States she had 2 episodes of vomiting yesterday, none today. She did not take any nausea medication. States she has had N/V throughout the pregnancy. Says it is common for her to have nausea and vomiting everyday.  Says she has had little to eat or drink today. States the abdominal pain comes and goes and is located throughout her lower abdomen.  No bleeding. States the pain in her belly worsens with movement.   OB History    Gravida Para Term Preterm AB Living   3 2 2  0 0 2   SAB TAB Ectopic Multiple Live Births   0 0 0 0 2      Obstetric Comments   Last child in 2007. Largest prior 8.5 lbs      Past Medical History:  Diagnosis Date  . Medical history non-contributory     Past Surgical History:  Procedure Laterality Date  . NO PAST SURGERIES      Family History  Problem Relation Age of Onset  . Cancer Maternal Grandmother        breast  . Diabetes Maternal Grandfather   . Hypertension Paternal Grandmother   . Hypertension Paternal Grandfather   . Stroke Neg Hx     Social History   Tobacco Use  . Smoking status: Never Smoker  . Smokeless tobacco: Never Used  . Tobacco comment: tried cigarettes a long time ago, never smoked as a habit  Substance Use Topics  . Alcohol use: No    Frequency: Never  . Drug use: No    Allergies:  Allergies  Allergen Reactions  . Pertussis Vaccines     Medications Prior to Admission  Medication Sig Dispense Refill Last Dose  . Prenatal Multivit-Min-Fe-FA (PRENATAL VITAMINS PO) Take by mouth.   Taking  . promethazine (PHENERGAN) 25 MG tablet Take 1 tablet (25 mg total) by mouth every 6 (six) hours as  needed for nausea or vomiting. 30 tablet 0 Taking   Results for orders placed or performed during the hospital encounter of 03/10/17 (from the past 48 hour(s))  Urinalysis, Routine w reflex microscopic     Status: Abnormal   Collection Time: 03/10/17  6:45 PM  Result Value Ref Range   Color, Urine YELLOW YELLOW   APPearance CLOUDY (A) CLEAR   Specific Gravity, Urine 1.023 1.005 - 1.030   pH 6.0 5.0 - 8.0   Glucose, UA NEGATIVE NEGATIVE mg/dL   Hgb urine dipstick SMALL (A) NEGATIVE   Bilirubin Urine NEGATIVE NEGATIVE   Ketones, ur 80 (A) NEGATIVE mg/dL   Protein, ur 30 (A) NEGATIVE mg/dL   Nitrite NEGATIVE NEGATIVE   Leukocytes, UA NEGATIVE NEGATIVE   RBC / HPF TOO NUMEROUS TO COUNT 0 - 5 RBC/hpf   WBC, UA 0-5 0 - 5 WBC/hpf   Bacteria, UA NONE SEEN NONE SEEN   Squamous Epithelial / LPF 0-5 (A) NONE SEEN   Mucus PRESENT    Ca Oxalate Crys, UA PRESENT     Comment: Performed at [facility], [address] [place], [place] [address]  CBC with Differential     Status: Abnormal   Collection  Time: 03/10/17  7:44 PM  Result Value Ref Range   WBC 8.4 4.0 - 10.5 K/uL   RBC 3.64 (L) 3.87 - 5.11 MIL/uL   Hemoglobin 11.8 (L) 12.0 - 15.0 g/dL   HCT 16.1 (L) 09.6 - 04.5 %   MCV 94.2 78.0 - 100.0 fL   MCH 32.4 26.0 - 34.0 pg   MCHC 34.4 30.0 - 36.0 g/dL   RDW 40.9 81.1 - 91.4 %   Platelets 143 (L) 150 - 400 K/uL   Neutrophils Relative % 68 %   Neutro Abs 5.7 1.7 - 7.7 K/uL   Lymphocytes Relative 24 %   Lymphs Abs 2.1 0.7 - 4.0 K/uL   Monocytes Relative 7 %   Monocytes Absolute 0.6 0.1 - 1.0 K/uL   Eosinophils Relative 1 %   Eosinophils Absolute 0.1 0.0 - 0.7 K/uL   Basophils Relative 0 %   Basophils Absolute 0.0 0.0 - 0.1 K/uL    Comment: Performed at [facility], [address], [place], [place] [address]   Review of Systems  Constitutional: Negative for fever.  Gastrointestinal: Positive for abdominal pain. Negative for constipation, diarrhea, nausea and vomiting.   Genitourinary: Negative for dysuria, frequency, urgency, vaginal bleeding and vaginal discharge.   Physical Exam   Blood pressure 129/73, pulse 76, temperature 98.7 F (37.1 C), temperature source Oral, height 5\' 8"  (1.727 m), weight 172 lb 4 oz (78.1 kg), last menstrual period 11/25/2016, SpO2 100 %.  Physical Exam  Constitutional: She is oriented to person, place, and time. She appears well-developed and well-nourished. No distress.  HENT:  Head: Normocephalic.  Eyes: Pupils are equal, round, and reactive to light.  Respiratory: Effort normal.  GI: Soft. Normal appearance. There is tenderness in the right lower quadrant, periumbilical area, suprapubic area and left lower quadrant. There is guarding. There is no rigidity and no rebound.  Musculoskeletal: Normal range of motion.  Neurological: She is alert and oriented to person, place, and time.  Skin: Skin is warm. She is not diaphoretic.  Psychiatric: Her behavior is normal.    MAU Course  Procedures  None  MDM  + fetal heart tones via doppler  Urine shows >80 of ketones.  LR bolus X 1 D5LR bolus X 1 Zofran 8 mg IV Pepcid 20 mg IV CBC: WBC WNL Urine culture pending  Report given to Sharen Counter who resumes care of the patient.    Duane Lope, NP 03/10/2017 9:08 PM  Results for orders placed or performed during the hospital encounter of 03/10/17 (from the past 24 hour(s))  Urinalysis, Routine w reflex microscopic     Status: Abnormal   Collection Time: 03/10/17  6:45 PM  Result Value Ref Range   Color, Urine YELLOW YELLOW   APPearance CLOUDY (A) CLEAR   Specific Gravity, Urine 1.023 1.005 - 1.030   pH 6.0 5.0 - 8.0   Glucose, UA NEGATIVE NEGATIVE mg/dL   Hgb urine dipstick SMALL (A) NEGATIVE   Bilirubin Urine NEGATIVE NEGATIVE   Ketones, ur 80 (A) NEGATIVE mg/dL   Protein, ur 30 (A) NEGATIVE mg/dL   Nitrite NEGATIVE NEGATIVE   Leukocytes, UA NEGATIVE NEGATIVE   RBC / HPF TOO NUMEROUS TO COUNT 0 -  5 RBC/hpf   WBC, UA 0-5 0 - 5 WBC/hpf   Bacteria, UA NONE SEEN NONE SEEN   Squamous Epithelial / LPF 0-5 (A) NONE SEEN   Mucus PRESENT    Ca Oxalate Crys, UA PRESENT   CBC with Differential  Status: Abnormal   Collection Time: 03/10/17  7:44 PM  Result Value Ref Range   WBC 8.4 4.0 - 10.5 K/uL   RBC 3.64 (L) 3.87 - 5.11 MIL/uL   Hemoglobin 11.8 (L) 12.0 - 15.0 g/dL   HCT 16.1 (L) 09.6 - 04.5 %   MCV 94.2 78.0 - 100.0 fL   MCH 32.4 26.0 - 34.0 pg   MCHC 34.4 30.0 - 36.0 g/dL   RDW 40.9 81.1 - 91.4 %   Platelets 143 (L) 150 - 400 K/uL   Neutrophils Relative % 68 %   Neutro Abs 5.7 1.7 - 7.7 K/uL   Lymphocytes Relative 24 %   Lymphs Abs 2.1 0.7 - 4.0 K/uL   Monocytes Relative 7 %   Monocytes Absolute 0.6 0.1 - 1.0 K/uL   Eosinophils Relative 1 %   Eosinophils Absolute 0.1 0.0 - 0.7 K/uL   Basophils Relative 0 %   Basophils Absolute 0.0 0.0 - 0.1 K/uL   Assessment and Plan   MDM: Pt with report of significant improvement in pain after IV fluids and meds in MAU.  With normal WBCs, no fever, and n/v present since early pregnancy and not new, unlikely appendicitis.  Blood noted in UA but no CVA tenderness or dysuria.  Most likely musculoskeletal pain/round ligament pain.  Rest/ice/heat/warm bath/Tylenol for pain.  F/U at [facility] office as scheduled. Return to MAU as needed for emergencies.  A: 1. Right lower quadrant pain   2. Pain of round ligament affecting pregnancy, antepartum   3. Abdominal pain during pregnancy in second trimester   4. Nausea and vomiting during pregnancy prior to [redacted] weeks gestation    P: D/C home  Sharen Counter, CNM 10:13 PM

## 2017-03-10 NOTE — MAU Note (Signed)
Pt presents with c/o sharp abdominal pain that began yesterday afternoon.  Denies VB or LOF.

## 2017-03-10 NOTE — Telephone Encounter (Signed)
Patient called after hours line during lunch complaining of lower abdominal pain that has not ease up. Patient states no matter if she is standing or sitting or laying down she is still having pain. Denies any bleeding or leaking of fluid.  Patient is 16-2 weeks.  Patient instructed to go to [facility] in [place] for evaluation as we do not have a provider here in office this afternoon. Patient states understanding and agreeable with plan. Armandina StammerJennifer Oskar Cretella RNBSN

## 2017-03-10 NOTE — MAU Note (Signed)
End of shift, report given to [place] Liberty[place]risten, Charity fundraiserN.

## 2017-03-12 LAB — CULTURE, OB URINE
Culture: NO GROWTH
Special Requests: NORMAL

## 2017-04-01 ENCOUNTER — Ambulatory Visit (INDEPENDENT_AMBULATORY_CARE_PROVIDER_SITE_OTHER): Payer: Medicaid Other | Admitting: Advanced Practice Midwife

## 2017-04-01 ENCOUNTER — Ambulatory Visit (HOSPITAL_COMMUNITY)
Admission: RE | Admit: 2017-04-01 | Discharge: 2017-04-01 | Disposition: A | Discharge status: Home / Self Care | Payer: Medicaid Other | Source: Ambulatory Visit | Attending: Advanced Practice Midwife | Admitting: Advanced Practice Midwife

## 2017-04-01 ENCOUNTER — Other Ambulatory Visit: Payer: Self-pay | Admitting: Advanced Practice Midwife

## 2017-04-01 ENCOUNTER — Encounter: Payer: Self-pay | Admitting: Advanced Practice Midwife

## 2017-04-01 DIAGNOSIS — Z3481 Encounter for supervision of other normal pregnancy, first trimester: Secondary | ICD-10-CM

## 2017-04-01 DIAGNOSIS — Z3A18 18 weeks gestation of pregnancy: Secondary | ICD-10-CM

## 2017-04-01 DIAGNOSIS — Z363 Encounter for antenatal screening for malformations: Secondary | ICD-10-CM | POA: Diagnosis present

## 2017-04-01 DIAGNOSIS — Z3A19 19 weeks gestation of pregnancy: Secondary | ICD-10-CM

## 2017-04-01 NOTE — Progress Notes (Signed)
   PRENATAL VISIT NOTE  Subjective:  Alphonzo LemmingsWhitney Tommie RaymondRidley is a 32 y.o. G3P2002 at 7774w3d being seen today for ongoing prenatal care.  She is currently monitored for the following issues for this low-risk pregnancy and has Threatened abortion in first trimester and Supervision of normal pregnancy in first trimester on their problem list.  Patient reports no complaints.  Contractions: Not present. Vag. Bleeding: None.  Movement: Present. Denies leaking of fluid.   The following portions of the patient's history were reviewed and updated as appropriate: allergies, current medications, past family history, past medical history, past social history, past surgical history and problem list. Problem list updated.  Objective:   Vitals:   04/01/17 0959  BP: 136/67  Pulse: 86  Weight: 176 lb (79.8 kg)    Fetal Status: Fetal Heart Rate (bpm): 155 Fundal Height: 19 cm Movement: Present     General:  Alert, oriented and cooperative. Patient is in no acute distress.  Skin: Skin is warm and dry. No rash noted.   Cardiovascular: Normal heart rate noted  Respiratory: Normal respiratory effort, no problems with respiration noted  Abdomen: Soft, gravid, appropriate for gestational age.  Pain/Pressure: Absent     Pelvic: Cervical exam deferred        Extremities: Normal range of motion.  Edema: None  Mental Status:  Normal mood and affect. Normal behavior. Normal judgment and thought content.   Assessment and Plan:  Pregnancy: G3P2002 at 4174w3d  1. Encounter for supervision of other normal pregnancy in first trimester - Anatomy US scheduled for later today   Preterm labor symptoms and general obstetric precautions including but not limited to vaginal bleeding, contractions, leaking of fluid and fetal movement were reviewed in detail with the patient. Please refer to After Visit Summary for other counseling recommendations.  Return in about 4 weeks (around 04/29/2017).   Thressa ShellerHeather Hogan, CNM

## 2017-04-01 NOTE — Patient Instructions (Signed)
AREA PEDIATRIC/FAMILY [place] [address] [place], [place]  [address] Phone - [phone]   Fax - [phone]  [facility] [address] [place], [place] [address] Phone - [phone]   Fax - [place] [address] [place], [place]  [address] Phone - [phone]   Fax - [phone]  [place]. [address], [place] [address] [place], [place]  [address] Phone - [phone]   Fax - [phone]  [place] [address] [place], [place]  [address] Phone - [phone]   Fax - [phone]  [facility] [address] [place], [place]  [address] Phone - [phone]   Fax - [place] [address], [place], [place]  [address] Phone - [phone]   Fax - [phone]  [place] [address] [place], [place] [address] [place], [place]  [address] Phone - [phone]   Fax - [place] [address] [place], [place]  [address] Phone - [phone]   Fax - [phone] [facility] [place]. [address] [place], [place]  [address] Phone - [phone]   Fax - [phone]  [place] [address] [place], [place]  [address] Phone - [phone]   Fax - [phone]  [facility] [place], [place], [place]  [address] Phone - [phone]   Fax - [place] [address] [place], [place]  [address] Phone - [phone]   Fax - [phone]  [facility] [address], [place], [place]  [address] Phone - [place], [place]  [address] Phone - [phone]   Fax - [place] [address] [place], [place]  [address] Phone - [phone]   Fax - [phone]  [place] [address] [place], [place]  [address] Phone - [phone]   Fax - [place]. [place], [place]  [address] Phone - [phone]   Fax - [place], [place], [place]  [address] Phone - [phone]   Fax - [place] [address] [place], [place]  [address] Phone - [phone]   Fax - [phone]  Samantha Myers [address], [place], [place]  [address] Phone - [phone]   Fax - [place] [address] [place], [place]  [address] Phone - [phone]   Fax - [phone]  [place] [address] [place], [place]  [address] Phone - [phone]   Fax - [phone] Samantha Myers [address] [place], [place]  [address] Phone - [phone]   Fax - [place] [address] [place] [address] [place], [place]  [address] Phone - [phone]   Fax - [place] [address], [place], [place]  [address] Phone - [phone]   Fax - [phone]  [place] [address] [place] [address] Phone [phone]  Fax [phone]  Safe Medications in Pregnancy   Acne: Benzoyl Peroxide Salicylic Acid  Backache/Headache: Tylenol: 2  regular strength every 4 hours OR              2 Extra strength every 6 hours  Colds/Coughs/Allergies: Benadryl (alcohol free) 25 mg every 6 hours as needed Breath right strips Claritin Cepacol throat lozenges Chloraseptic throat spray Cold-Eeze- up to three times per day Cough drops, alcohol free Flonase (by prescription only) Guaifenesin Mucinex Robitussin DM (plain only,  alcohol free) Saline nasal spray/drops Sudafed (pseudoephedrine) & Actifed ** use only after [redacted] weeks gestation and if you do not have high blood pressure Tylenol Vicks Vaporub Zinc lozenges Zyrtec   Constipation: Colace Ducolax suppositories Fleet enema Glycerin suppositories Metamucil Milk of magnesia Miralax Senokot Smooth move tea  Diarrhea: Kaopectate Imodium A-D  *NO pepto Bismol  Hemorrhoids: Anusol Anusol HC Preparation H Tucks  Indigestion: Tums Maalox Mylanta Zantac  Pepcid  Insomnia: Benadryl (alcohol free) '25mg'$  every 6 hours as needed Tylenol PM Unisom, no Gelcaps  Leg Cramps: Tums MagGel  Nausea/Vomiting:  Bonine Dramamine Emetrol Ginger extract Sea bands Meclizine  Nausea medication to take during pregnancy:  Unisom (doxylamine succinate 25 mg tablets) Take one tablet daily at bedtime. If symptoms are not adequately controlled, the dose can be increased to a maximum recommended dose of two tablets daily (1/2 tablet in the morning, 1/2 tablet mid-afternoon and one at bedtime). Vitamin B6 '100mg'$  tablets. Take one tablet twice a day (up to 200 mg per day).  Skin Rashes: Aveeno products Benadryl cream or '25mg'$  every 6 hours as needed Calamine Lotion 1% cortisone cream  Yeast infection: Gyne-lotrimin 7 Monistat 7   **If taking multiple medications, please check labels to avoid duplicating the same active ingredients **take medication as directed on the label ** Do not exceed 4000 mg of tylenol in 24 hours **Do not take medications that contain aspirin or ibuprofen  Childbirth Education Options: [facility] Department Classes:  Childbirth education classes can help you get ready for a positive parenting experience. You can also meet other expectant parents and get free stuff for your baby. Each class runs for five weeks on the same night and costs $45 for the mother-to-be and her support person. Medicaid covers the cost if  you are eligible. Call [phone] to register. [facility] Childbirth Education:  [phone] or [phone] or [email or web address]  Baby & Me Class: Discuss newborn & infant parenting and family adjustment issues with other new mothers in a relaxed environment. Each week brings a new speaker or baby-centered activity. We encourage new mothers to join [place] every Thursday at 11:00am. Babies birth until crawling. No registration or fee. Daddy [employer]: This course offers Dads-to-be the tools and knowledge needed to feel confident on their journey to becoming new fathers. Experienced dads, who have been trained as coaches, teach dads-to-be how to hold, comfort, diaper, swaddle and play with their infant while being able to support the new mom as well. A class for men taught by men. $25/dad Big Brother/Big Sister: Let your children share in the joy of a new brother or sister in this special class designed just for them. Class includes discussion about how families care for babies: swaddling, holding, diapering, safety as well as how they can be helpful in their new role. This class is designed for children ages 61 to 43, but any age is welcome. Please register each child individually. $5/child  Mom Talk: This mom-led group offers support and connection to mothers as they journey through the adjustments and struggles of that sometimes overwhelming first year  after the birth of a child. Tuesdays at 10:00am and Thursdays at 6:00pm. Babies welcome. No registration or fee. Breastfeeding Support Group: This group is a mother-to-mother support circle where moms have the opportunity to share their breastfeeding experiences. A Lactation Consultant is present for questions and concerns. Meets each Tuesday at 11:00am. No fee or registration. Breastfeeding Your Baby: Learn what to expect in the first days of breastfeeding your newborn.  This class will help you feel more confident with the skills needed to  begin your breastfeeding experience. Many new mothers are concerned about breastfeeding after leaving the hospital. This class will also address the most common fears and challenges about breastfeeding during the first few weeks, months and beyond. (call for fee) Comfort Techniques and Tour: This 2 hour interactive class will provide you the opportunity to learn & practice hands-on techniques that can help relieve some of the discomfort of labor and encourage your baby to rotate toward the best position for birth. You and your partner will be able to try a variety of labor positions with birth balls and rebozos as well as practice breathing, relaxation, and visualization techniques. A tour of the [facility] is included with this class. $20 per registrant and support person Childbirth Class- Weekend Option: This class is a Weekend version of our Birth & Baby series. It is designed for parents who have a difficult time fitting several weeks of classes into their schedule. It covers the care of your newborn and the basics of labor and childbirth. It also includes a [place] [facility] and lunch. The class is held two consecutive days: beginning on Friday evening from 6:30 - 8:30 p.m. and the next day, Saturday from 9 a.m. - 4 p.m. (call for fee) Doren Custard Class: Interested in a waterbirth?  This informational class will help you discover whether waterbirth is the right fit for you. Education about waterbirth itself, supplies you would need and how to assemble your support team is what you can expect from this class. Some obstetrical practices require this class in order to pursue a waterbirth. (Not all obstetrical practices offer waterbirth-check with your healthcare provider.) Register only the expectant mom, but you are encouraged to bring your partner to class! Required if planning waterbirth, no fee. Infant/Child CPR: Parents, grandparents, babysitters,  and friends learn Cardio-Pulmonary Resuscitation skills for infants and children. You will also learn how to treat both conscious and unconscious choking in infants and children. This Family & Friends program does not offer certification. Register each participant individually to ensure that enough mannequins are available. (Call for fee) Grandparent Love: Expecting a grandbaby? This class is for you! Learn about the latest infant care and safety recommendations and ways to support your own child as he or she transitions into the parenting role. Taught by Registered Nurses who are childbirth instructors, but most importantly...they are grandmothers too! $10/person. Childbirth Class- Natural Childbirth: This series of 5 weekly classes is for expectant parents who want to learn and practice natural methods of coping with the process of labor and childbirth. Relaxation, breathing, massage, visualization, role of the partner, and helpful positioning are highlighted. Participants learn how to be confident in their body's ability to give birth. This class will empower and help parents make informed decisions about their own care. Includes discussion that will help new parents transition into the immediate postpartum period. [facility] is included. We suggest taking this class between 25-32  weeks, but it's only a recommendation. $75 per registrant and one support person or $30 Medicaid. Childbirth Class- 3 week Series: This option of 3 weekly classes helps you and your labor partner prepare for childbirth. Newborn care, labor & birth, cesarean birth, pain management, and comfort techniques are discussed and a [place] [facility] is included. The class meets at the same time, on the same day of the week for 3 consecutive weeks beginning with the starting date you choose. $60 for registrant and one support person.  Marvelous Multiples: Expecting twins,  triplets, or more? This class covers the differences in labor, birth, parenting, and breastfeeding issues that face multiples' parents. NICU tour is included. Led by a Certified Childbirth Educator who is the mother of twins. No fee. Caring for Baby: This class is for expectant and adoptive parents who want to learn and practice the most up-to-date newborn care for their babies. Focus is on birth through the first six weeks of life. Topics include feeding, bathing, diapering, crying, umbilical cord care, circumcision care and safe sleep. Parents learn to recognize symptoms of illness and when to call the pediatrician. Register only the mom-to-be and your partner or support person can plan to come with you! $10 per registrant and support person Childbirth Class- online option: This online class offers you the freedom to complete a Birth and Baby series in the comfort of your own home. The flexibility of this option allows you to review sections at your own pace, at times convenient to you and your support people. It includes additional video information, animations, quizzes, and extended activities. Get organized with helpful eClass tools, checklists, and trackers. Once you register online for the class, you will receive an email within a few days to accept the invitation and begin the class when the time is right for you. The content will be available to you for 60 days. $60 for 60 days of online access for you and your support people.  Local Doulas: Natural Baby Doulas [email or web address] Tel: [phone] [email or web address] [employer] [phone] [email or web address] The Labor Hassell Halim  (also do waterbirth tub rental) [phone] [email or web address] Triad Birth Doula [phone] [email or web address] [email or web address].fi [facility] Rhythms   [phone] [email or web address] [employer] (PADA) [email or web address]/ [place] Birth and Baby  [email or web address] Considering Waterbirth? Guide for patients at [facility] for [employer]  Why consider waterbirth?  . Gentle birth for babies . Less pain medicine used in labor . May allow for passive descent/less pushing . May reduce perineal tears  . More mobility and instinctive maternal position changes . Increased maternal relaxation . Reduced blood pressure in labor  Is waterbirth safe? What are the risks of infection, drowning or other complications?  . Infection: o Very low risk (3.7 % for tub vs 4.8% for bed) o 7 in 8000 waterbirths with documented infection o Poorly cleaned equipment most common cause o Slightly lower group B strep transmission rate  . Drowning o Maternal:  - Very low risk   - Related to seizures or fainting o Newborn:  - Very low risk. No evidence of increased risk of respiratory problems in multiple large studies - Physiological protection from breathing under water - Avoid underwater birth if there are any fetal complications - Once baby's head is out of the water, keep it out.  . Birth complication o Some reports of cord trauma, but risk decreased by bringing baby to surface gradually o No evidence of increased risk of shoulder dystocia. Mothers  can usually change positions faster in water than in a bed, possibly aiding the maneuvers to free the shoulder.   You must attend a Doren Custard class at [facility]  3rd Wednesday of every month from 7-9pm  [employer] by calling [phone] or online at [email or web address]  Bring [place] the certificate from the class to your prenatal appointment  Meet with a midwife at 36 weeks to see if you can still plan a waterbirth and to sign the consent.   Purchase or rent the following supplies:   Water  Birth Pool (Birth Pool in a Box or [place] for instance)  (Tubs start ~$125)  Single-use disposable tub liner designed for your brand of tub  New garden hose labeled "lead-free", "suitable for drinking water",  Electric drain pump to remove water (We recommend 792 gallon per hour or greater pump.)   Separate garden hose to remove the dirty water  Fish net  Bathing suit top (optional)  Long-handled mirror (optional)  Places to purchase or rent supplies  GotWebTools.is for tub purchases and supplies  Waterbirthsolutions.com for tub purchases and supplies  The Labor Ladies (www.[email or web address]) $275 for tub rental/set-up & take down/kit   [employer] (http://www.[place]ming.com/.htm) Information regarding doulas (labor support) who provide pool rentals  Our practice has a Birth Pool in a Box tub at the hospital that you may borrow on a first-come-first-served basis. It is your responsibility to to set up, clean and break down the tub. We cannot guarantee the availability of this tub in advance. You are responsible for bringing all accessories listed above. If you do not have all necessary supplies you cannot have a waterbirth.    Things that would prevent you from having a waterbirth:  Premature, <37wks  Previous cesarean birth  Presence of thick meconium-stained fluid  Multiple gestation (Twins, triplets, etc.)  Uncontrolled diabetes or gestational diabetes requiring medication  Hypertension requiring medication or diagnosis of pre-eclampsia  Heavy vaginal bleeding  Non-reassuring fetal heart rate  Active infection (MRSA, etc.). Group B Strep is NOT a contraindication for  waterbirth.  If your labor has to be induced and induction method requires continuous  monitoring of the baby's heart rate  Other risks/issues identified by your obstetrical provider  Please remember that birth is unpredictable. Under certain unforeseeable  circumstances your provider may advise against giving birth in the tub. These decisions will be made on a case-by-case basis and with the safety of you and your baby as our highest priority.

## 2017-04-29 ENCOUNTER — Ambulatory Visit (INDEPENDENT_AMBULATORY_CARE_PROVIDER_SITE_OTHER): Payer: Medicaid Other | Admitting: Advanced Practice Midwife

## 2017-04-29 VITALS — BP 132/68 | HR 88 | Wt 184.0 lb

## 2017-04-29 DIAGNOSIS — O99350 Diseases of the nervous system complicating pregnancy, unspecified trimester: Secondary | ICD-10-CM

## 2017-04-29 DIAGNOSIS — G5600 Carpal tunnel syndrome, unspecified upper limb: Secondary | ICD-10-CM

## 2017-04-29 DIAGNOSIS — Z3490 Encounter for supervision of normal pregnancy, unspecified, unspecified trimester: Secondary | ICD-10-CM

## 2017-04-29 MED ORDER — WRIST BRACE DELUXE/RIGHT S/M MISC: 1.0000 | Freq: Every day | 0 refills | Status: DC

## 2017-04-29 MED ORDER — WRIST BRACE DELUXE/LEFT S/M MISC: 1.0000 | Freq: Every day | 0 refills | Status: DC

## 2017-04-29 NOTE — Progress Notes (Signed)
   PRENATAL VISIT NOTE  Subjective:  Samantha Myers is a 32 y.o. G3P2002 at 5025w3d being seen today for ongoing prenatal care.  She is currently monitored for the following issues for this low-risk pregnancy and has Threatened abortion in first trimester; Supervision of normal pregnancy in first trimester; Encounter for antenatal screening for malformation; and [redacted] weeks gestation of pregnancy on their problem list.  Patient reports tingling/numbness that is intermittent in both left and right hands.  Contractions: Not present. Vag. Bleeding: None.  Movement: Present. Denies leaking of fluid.   The following portions of the patient's history were reviewed and updated as appropriate: allergies, current medications, past family history, past medical history, past social history, past surgical history and problem list. Problem list updated.  Objective:   Vitals:   04/29/17 1020  BP: 132/68  Pulse: 88  Weight: 184 lb (83.5 kg)    Fetal Status: Fetal Heart Rate (bpm): 158   Movement: Present     General:  Alert, oriented and cooperative. Patient is in no acute distress.  Skin: Skin is warm and dry. No rash noted.   Cardiovascular: Normal heart rate noted  Respiratory: Normal respiratory effort, no problems with respiration noted  Abdomen: Soft, gravid, appropriate for gestational age.  Pain/Pressure: Absent     Pelvic: Cervical exam deferred        Extremities: Normal range of motion.  Edema: None  Mental Status: Normal mood and affect. Normal behavior. Normal judgment and thought content.   Assessment and Plan:  Pregnancy: G3P2002 at 3125w3d  1. Prenatal care, antepartum --F/U to complete anatomy ordered - US MFM OB FOLLOW UP; Future  2. Pregnancy related carpal tunnel syndrome, antepartum --rest/ice, discussed likely resolution after pregnancy - Elastic Bandages & Supports (WRIST BRACE DELUXE/LEFT S/M) MISC; 1 Device by Does not apply route daily.  Dispense: 1 each; Refill: 0 -  Elastic Bandages & Supports (WRIST BRACE DELUXE/RIGHT S/M) MISC; 1 Device by Does not apply route daily.  Dispense: 1 each; Refill: 0  Preterm labor symptoms and general obstetric precautions including but not limited to vaginal bleeding, contractions, leaking of fluid and fetal movement were reviewed in detail with the patient. Please refer to After Visit Summary for other counseling recommendations.  Return in about 1 month (around 05/27/2017).  No future appointments.  Sharen CounterLisa Leftwich-Kirby, CNM

## 2017-04-29 NOTE — Patient Instructions (Signed)

## 2017-05-06 ENCOUNTER — Encounter: Payer: Self-pay | Admitting: Advanced Practice Midwife

## 2017-05-08 ENCOUNTER — Telehealth: Payer: Self-pay

## 2017-05-08 DIAGNOSIS — G5600 Carpal tunnel syndrome, unspecified upper limb: Secondary | ICD-10-CM

## 2017-05-08 DIAGNOSIS — O99350 Diseases of the nervous system complicating pregnancy, unspecified trimester: Principal | ICD-10-CM

## 2017-05-08 MED ORDER — WRIST BRACE DELUXE/LEFT S/M MISC
1.0000 | Freq: Every day | 0 refills | Status: DC
Start: 2017-05-08 — End: 2017-06-11

## 2017-05-08 MED ORDER — WRIST BRACE DELUXE/RIGHT S/M MISC: 1.0000 | Freq: Every day | 0 refills | Status: DC

## 2017-05-08 NOTE — Telephone Encounter (Signed)
Patient need prescriptions printed for wrist braces. Armandina StammerJennifer Aveline Daus RN

## 2017-05-14 ENCOUNTER — Ambulatory Visit (HOSPITAL_COMMUNITY)
Admission: RE | Admit: 2017-05-14 | Discharge: 2017-05-14 | Disposition: A | Discharge status: Home / Self Care | Payer: Medicaid Other | Source: Ambulatory Visit | Attending: Advanced Practice Midwife | Admitting: Advanced Practice Midwife

## 2017-05-14 DIAGNOSIS — Z3490 Encounter for supervision of normal pregnancy, unspecified, unspecified trimester: Secondary | ICD-10-CM | POA: Diagnosis present

## 2017-05-14 DIAGNOSIS — Z362 Encounter for other antenatal screening follow-up: Secondary | ICD-10-CM | POA: Diagnosis present

## 2017-05-14 DIAGNOSIS — Z3A25 25 weeks gestation of pregnancy: Secondary | ICD-10-CM | POA: Diagnosis not present

## 2017-05-14 DIAGNOSIS — Z3492 Encounter for supervision of normal pregnancy, unspecified, second trimester: Secondary | ICD-10-CM | POA: Diagnosis not present

## 2017-05-27 ENCOUNTER — Ambulatory Visit (INDEPENDENT_AMBULATORY_CARE_PROVIDER_SITE_OTHER): Payer: Medicaid Other | Admitting: Advanced Practice Midwife

## 2017-05-27 ENCOUNTER — Encounter: Payer: Self-pay | Admitting: Advanced Practice Midwife

## 2017-05-27 VITALS — BP 130/78 | HR 81 | Wt 183.0 lb

## 2017-05-27 DIAGNOSIS — Z2804 Immunization not carried out because of patient allergy to vaccine or component: Secondary | ICD-10-CM

## 2017-05-27 DIAGNOSIS — Z3490 Encounter for supervision of normal pregnancy, unspecified, unspecified trimester: Secondary | ICD-10-CM

## 2017-05-27 NOTE — Progress Notes (Signed)
Declines TDAP due to allegery to Pertussis vaccine. Armandina Stammer RN

## 2017-05-27 NOTE — Patient Instructions (Signed)

## 2017-05-27 NOTE — Progress Notes (Signed)
   PRENATAL VISIT NOTE  Subjective:  Samantha Myers is a 32 y.o. G3P2002 at [redacted]w[redacted]d being seen today for ongoing prenatal care.  She is currently monitored for the following issues for this low-risk pregnancy and has Threatened abortion in first trimester; Supervision of normal pregnancy in first trimester; Encounter for antenatal screening for malformation; [redacted] weeks gestation of pregnancy; and Immunization not carried out because of allergy to vaccine or component on their problem list.  Patient reports no complaints.  Contractions: Not present. Vag. Bleeding: None.  Movement: Present. Denies leaking of fluid.   The following portions of the patient's history were reviewed and updated as appropriate: allergies, current medications, past family history, past medical history, past social history, past surgical history and problem list. Problem list updated.  Objective:   Vitals:   05/27/17 0825  BP: 130/78  Pulse: 81  Weight: 183 lb (83 kg)    Fetal Status: Fetal Heart Rate (bpm): 153   Movement: Present     General:  Alert, oriented and cooperative. Patient is in no acute distress.  Skin: Skin is warm and dry. No rash noted.   Cardiovascular: Normal heart rate noted  Respiratory: Normal respiratory effort, no problems with respiration noted  Abdomen: Soft, gravid, appropriate for gestational age.  Pain/Pressure: Absent     Pelvic: Cervical exam deferred        Extremities: Normal range of motion.  Edema: None  Mental Status: Normal mood and affect. Normal behavior. Normal judgment and thought content.   Assessment and Plan:  Pregnancy: G3P2002 at [redacted]w[redacted]d  1. Prenatal care, antepartum       - Glucose Tolerance, 2 Hours w/1 Hour - RPR - HIV antibody (with reflex) - CBC - [place] MFM OB FOLLOW UP; Future       Anatomy US WNL for anatomy completion, but Dr Sherrie George states some abdominal circ were low so they want to repeat in mid-May  2. 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 Please refer to After Visit Summary for other counseling recommendations.  No follow-ups on file.  Future Appointments  Date Time Provider Department Center  06/11/2017 11:00 AM [facility] [place] 3 [facility]-M[facility]US M[facility]-US  06/11/2017  1:45 PM Anyanwu, Jethro Bastos, MD [facility] None    Wynelle Bourgeois, CNM

## 2017-05-28 LAB — RPR: RPR Ser Ql: NONREACTIVE

## 2017-05-28 LAB — CBC
HEMATOCRIT: 38 % (ref 34.0–46.6)
HEMOGLOBIN: 12.4 g/dL (ref 11.1–15.9)
MCH: 31.6 pg (ref 26.6–33.0)
MCHC: 32.6 g/dL (ref 31.5–35.7)
MCV: 97 fL (ref 79–97)
Platelets: 160 10*3/uL (ref 150–379)
RBC: 3.92 x10E6/uL (ref 3.77–5.28)
RDW: 13.6 % (ref 12.3–15.4)
WBC: 8.2 10*3/uL (ref 3.4–10.8)

## 2017-05-28 LAB — GLUCOSE TOLERANCE, 2 HOURS W/ 1HR
GLUCOSE, 1 HOUR: 82 mg/dL (ref 65–179)
Glucose, 2 hour: 41 mg/dL — ABNORMAL LOW (ref 65–152)
Glucose, Fasting: 64 mg/dL — ABNORMAL LOW (ref 65–91)

## 2017-05-28 LAB — HIV ANTIBODY (ROUTINE TESTING W REFLEX): HIV SCREEN 4TH GENERATION: NONREACTIVE

## 2017-06-11 ENCOUNTER — Ambulatory Visit (INDEPENDENT_AMBULATORY_CARE_PROVIDER_SITE_OTHER): Payer: Medicaid Other | Admitting: Obstetrics & Gynecology

## 2017-06-11 ENCOUNTER — Ambulatory Visit (HOSPITAL_COMMUNITY)
Admission: RE | Admit: 2017-06-11 | Discharge: 2017-06-11 | Disposition: A | Discharge status: Home / Self Care | Payer: Medicaid Other | Source: Ambulatory Visit | Attending: Advanced Practice Midwife | Admitting: Advanced Practice Midwife

## 2017-06-11 ENCOUNTER — Other Ambulatory Visit: Payer: Self-pay | Admitting: Advanced Practice Midwife

## 2017-06-11 VITALS — BP 116/63 | HR 80 | Wt 209.0 lb

## 2017-06-11 DIAGNOSIS — Z3483 Encounter for supervision of other normal pregnancy, third trimester: Secondary | ICD-10-CM

## 2017-06-11 DIAGNOSIS — Z3493 Encounter for supervision of normal pregnancy, unspecified, third trimester: Secondary | ICD-10-CM | POA: Insufficient documentation

## 2017-06-11 DIAGNOSIS — Z3A29 29 weeks gestation of pregnancy: Secondary | ICD-10-CM | POA: Diagnosis not present

## 2017-06-11 DIAGNOSIS — Z362 Encounter for other antenatal screening follow-up: Secondary | ICD-10-CM | POA: Insufficient documentation

## 2017-06-11 DIAGNOSIS — Z3490 Encounter for supervision of normal pregnancy, unspecified, unspecified trimester: Secondary | ICD-10-CM

## 2017-06-11 NOTE — Progress Notes (Signed)
   PRENATAL VISIT NOTE  Subjective:  Samantha Myers is a 32 y.o. G3P2002 at [redacted]w[redacted]d being seen today for ongoing prenatal care.  She is currently monitored for the following issues for this low-risk pregnancy and has Supervision of normal pregnancy in third trimester and Immunization not carried out because of allergy to vaccine or component on their problem list.  Patient reports no complaints.  Contractions: Irritability. Vag. Bleeding: None.  Movement: Present. Denies leaking of fluid.   The following portions of the patient's history were reviewed and updated as appropriate: allergies, current medications, past family history, past medical history, past social history, past surgical history and problem list. Problem list updated.  Objective:   Vitals:   06/11/17 1402  BP: 116/63  Pulse: 80  Weight: 209 lb (94.8 kg)    Fetal Status: Fetal Heart Rate (bpm): 160 Fundal Height: 29 cm Movement: Present     General:  Alert, oriented and cooperative. Patient is in no acute distress.  Skin: Skin is warm and dry. No rash noted.   Cardiovascular: Normal heart rate noted  Respiratory: Normal respiratory effort, no problems with respiration noted  Abdomen: Soft, gravid, appropriate for gestational age.  Pain/Pressure: Absent     Pelvic: Cervical exam deferred        Extremities: Normal range of motion.  Edema: Trace  Mental Status: Normal mood and affect. Normal behavior. Normal judgment and thought content.   Results for orders placed or performed in visit on 05/27/17 (from the past 672 hour(s))  Glucose Tolerance, 2 Hours w/1 Hour   Collection Time: 05/27/17  9:25 AM  Result Value Ref Range   Glucose, Fasting 64 (L) 65 - 91 mg/dL   Glucose, 1 hour 82 65 - 179 mg/dL   Glucose, 2 hour 41 (L) 65 - 152 mg/dL  RPR   Collection Time: 05/27/17  9:25 AM  Result Value Ref Range   RPR Ser Ql Non Reactive Non Reactive  HIV antibody (with reflex)   Collection Time: 05/27/17  9:25 AM  Result  Value Ref Range   HIV Screen 4th Generation wRfx Non Reactive Non Reactive  CBC   Collection Time: 05/27/17  9:25 AM  Result Value Ref Range   WBC 8.2 3.4 - 10.8 x10E3/uL   RBC 3.92 3.77 - 5.28 x10E6/uL   Hemoglobin 12.4 11.1 - 15.9 g/dL   Hematocrit 84.1 66.0 - 46.6 %   MCV 97 79 - 97 fL   MCH 31.6 26.6 - 33.0 pg   MCHC 32.6 31.5 - 35.7 g/dL   RDW 63.0 16.0 - 10.9 %   Platelets 160 150 - 379 x10E3/uL    Assessment and Plan:  Pregnancy: G3P2002 at [redacted]w[redacted]d  1. Encounter for supervision of other normal pregnancy in third trimester Results reviewed with patient. Preterm labor symptoms and general obstetric precautions including but not limited to vaginal bleeding, contractions, leaking of fluid and fetal movement were reviewed in detail with the patient. Please refer to After Visit Summary for other counseling recommendations.  Return in about 2 weeks (around 06/25/2017) for OB Visit.  No future appointments.  Jaynie Collins, MD

## 2017-06-11 NOTE — Patient Instructions (Signed)
Return to clinic for any scheduled appointments or for any gynecologic concerns as needed.    Third Trimester of Pregnancy The third trimester is from week 28 through week 40 (months 7 through 9). The third trimester is a time when the unborn baby (fetus) is growing rapidly. At the end of the ninth month, the fetus is about 20 inches in length and weighs 6-10 pounds. Body changes during your third trimester Your body will continue to go through many changes during pregnancy. The changes vary from woman to woman. During the third trimester:  Your weight will continue to increase. You can expect to gain 25-35 pounds (11-16 kg) by the end of the pregnancy.  You may begin to get stretch marks on your hips, abdomen, and breasts.  You may urinate more often because the fetus is moving lower into your pelvis and pressing on your bladder.  You may develop or continue to have heartburn. This is caused by increased hormones that slow down muscles in the digestive tract.  You may develop or continue to have constipation because increased hormones slow digestion and cause the muscles that push waste through your intestines to relax.  You may develop hemorrhoids. These are swollen veins (varicose veins) in the rectum that can itch or be painful.  You may develop swollen, bulging veins (varicose veins) in your legs.  You may have increased body aches in the pelvis, back, or thighs. This is due to weight gain and increased hormones that are relaxing your joints.  You may have changes in your hair. These can include thickening of your hair, rapid growth, and changes in texture. Some women also have hair loss during or after pregnancy, or hair that feels dry or thin. Your hair will most likely return to normal after your baby is born.  Your breasts will continue to grow and they will continue to become tender. A yellow fluid (colostrum) may leak from your breasts. This is the first milk you are producing  for your baby.  Your belly button may stick out.  You may notice more swelling in your hands, face, or ankles.  You may have increased tingling or numbness in your hands, arms, and legs. The skin on your belly may also feel numb.  You may feel short of breath because of your expanding uterus.  You may have more problems sleeping. This can be caused by the size of your belly, increased need to urinate, and an increase in your body's metabolism.  You may notice the fetus "dropping," or moving lower in your abdomen (lightening).  You may have increased vaginal discharge.  You may notice your joints feel loose and you may have pain around your pelvic bone.  What to expect at prenatal visits You will have prenatal exams every 2 weeks until week 36. Then you will have weekly prenatal exams. During a routine prenatal visit:  You will be weighed to make sure you and the baby are growing normally.  Your blood pressure will be taken.  Your abdomen will be measured to track your baby's growth.  The fetal heartbeat will be listened to.  Any test results from the previous visit will be discussed.  You may have a cervical check near your due date to see if your cervix has softened or thinned (effaced).  You will be tested for Group B streptococcus. This happens between 35 and 37 weeks.  Your health care provider may ask you:  What your birth plan is.  How you are feeling.  If you are feeling the baby move.  If you have had any abnormal symptoms, such as leaking fluid, bleeding, severe headaches, or abdominal cramping.  If you are using any tobacco products, including cigarettes, chewing tobacco, and electronic cigarettes.  If you have any questions.  Other tests or screenings that may be performed during your third trimester include:  Blood tests that check for low iron levels (anemia).  Fetal testing to check the health, activity level, and growth of the fetus. Testing is done  if you have certain medical conditions or if there are problems during the pregnancy.  Nonstress test (NST). This test checks the health of your baby to make sure there are no signs of problems, such as the baby not getting enough oxygen. During this test, a belt is placed around your belly. The baby is made to move, and its heart rate is monitored during movement.  What is false labor? False labor is a condition in which you feel small, irregular tightenings of the muscles in the womb (contractions) that usually go away with rest, changing position, or drinking water. These are called Braxton Hicks contractions. Contractions may last for hours, days, or even weeks before true labor sets in. If contractions come at regular intervals, become more frequent, increase in intensity, or become painful, you should see your health care provider. What are the signs of labor?  Abdominal cramps.  Regular contractions that start at 10 minutes apart and become stronger and more frequent with time.  Contractions that start on the top of the uterus and spread down to the lower abdomen and back.  Increased pelvic pressure and dull back pain.  A watery or bloody mucus discharge that comes from the vagina.  Leaking of amniotic fluid. This is also known as your "water breaking." It could be a slow trickle or a gush. Let your health care provider know if it has a color or strange odor. If you have any of these signs, call your health care provider right away, even if it is before your due date. Follow these instructions at home: Medicines  Follow your health care provider's instructions regarding medicine use. Specific medicines may be either safe or unsafe to take during pregnancy.  Take a prenatal vitamin that contains at least 600 micrograms (mcg) of folic acid.  If you develop constipation, try taking a stool softener if your health care provider approves. Eating and drinking  Eat a balanced diet that  includes fresh fruits and vegetables, whole grains, good sources of protein such as meat, eggs, or tofu, and low-fat dairy. Your health care provider will help you determine the amount of weight gain that is right for you.  Avoid raw meat and uncooked cheese. These carry germs that can cause birth defects in the baby.  If you have low calcium intake from food, talk to your health care provider about whether you should take a daily calcium supplement.  Eat four or five small meals rather than three large meals a day.  Limit foods that are high in fat and processed sugars, such as fried and sweet foods.  To prevent constipation: ? Drink enough fluid to keep your urine clear or pale yellow. ? Eat foods that are high in fiber, such as fresh fruits and vegetables, whole grains, and beans. Activity  Exercise only as directed by your health care provider. Most women can continue their usual exercise routine during pregnancy. Try to exercise for 30  minutes at least 5 days a week. Stop exercising if you experience uterine contractions.  Avoid heavy lifting.  Do not exercise in extreme heat or humidity, or at high altitudes.  Wear low-heel, comfortable shoes.  Practice good posture.  You may continue to have sex unless your health care provider tells you otherwise. Relieving pain and discomfort  Take frequent breaks and rest with your legs elevated if you have leg cramps or low back pain.  Take warm sitz baths to soothe any pain or discomfort caused by hemorrhoids. Use hemorrhoid cream if your health care provider approves.  Wear a good support bra to prevent discomfort from breast tenderness.  If you develop varicose veins: ? Wear support pantyhose or compression stockings as told by your healthcare provider. ? Elevate your feet for 15 minutes, 3-4 times a day. Prenatal care  Write down your questions. Take them to your prenatal visits.  Keep all your prenatal visits as told by your  health care provider. This is important. Safety  Wear your seat belt at all times when driving.  Make a list of emergency phone numbers, including numbers for family, friends, the hospital, and police and fire departments. General instructions  Avoid cat litter boxes and soil used by cats. These carry germs that can cause birth defects in the baby. If you have a cat, ask someone to clean the litter box for you.  Do not travel far distances unless it is absolutely necessary and only with the approval of your health care provider.  Do not use hot tubs, steam rooms, or saunas.  Do not drink alcohol.  Do not use any products that contain nicotine or tobacco, such as cigarettes and e-cigarettes. If you need help quitting, ask your health care provider.  Do not use any medicinal herbs or unprescribed drugs. These chemicals affect the formation and growth of the baby.  Do not douche or use tampons or scented sanitary pads.  Do not cross your legs for long periods of time.  To prepare for the arrival of your baby: ? Take prenatal classes to understand, practice, and ask questions about labor and delivery. ? Make a trial run to the hospital. ? Visit the hospital and tour the maternity area. ? Arrange for maternity or paternity leave through employers. ? Arrange for family and friends to take care of pets while you are in the hospital. ? Purchase a rear-facing car seat and make sure you know how to install it in your car. ? Pack your hospital bag. ? Prepare the baby's nursery. Make sure to remove all pillows and stuffed animals from the baby's crib to prevent suffocation.  Visit your dentist if you have not gone during your pregnancy. Use a soft toothbrush to brush your teeth and be gentle when you floss. Contact a health care provider if:  You are unsure if you are in labor or if your water has broken.  You become dizzy.  You have mild pelvic cramps, pelvic pressure, or nagging pain  in your abdominal area.  You have lower back pain.  You have persistent nausea, vomiting, or diarrhea.  You have an unusual or bad smelling vaginal discharge.  You have pain when you urinate. Get help right away if:  Your water breaks before 37 weeks.  You have regular contractions less than 5 minutes apart before 37 weeks.  You have a fever.  You are leaking fluid from your vagina.  You have spotting or bleeding from your vagina.  You have severe abdominal pain or cramping.  You have rapid weight loss or weight gain.  You have shortness of breath with chest pain.  You notice sudden or extreme swelling of your face, hands, ankles, feet, or legs.  Your baby makes fewer than 10 movements in 2 hours.  You have severe headaches that do not go away when you take medicine.  You have vision changes. Summary  The third trimester is from week 28 through week 40, months 7 through 9. The third trimester is a time when the unborn baby (fetus) is growing rapidly.  During the third trimester, your discomfort may increase as you and your baby continue to gain weight. You may have abdominal, leg, and back pain, sleeping problems, and an increased need to urinate.  During the third trimester your breasts will keep growing and they will continue to become tender. A yellow fluid (colostrum) may leak from your breasts. This is the first milk you are producing for your baby.  False labor is a condition in which you feel small, irregular tightenings of the muscles in the womb (contractions) that eventually go away. These are called Braxton Hicks contractions. Contractions may last for hours, days, or even weeks before true labor sets in.  Signs of labor can include: abdominal cramps; regular contractions that start at 10 minutes apart and become stronger and more frequent with time; watery or bloody mucus discharge that comes from the vagina; increased pelvic pressure and dull back pain; and  leaking of amniotic fluid. This information is not intended to replace advice given to you by your health care provider. Make sure you discuss any questions you have with your health care provider. Document Released: 01/08/2001 Document Revised: 06/22/2015 Document Reviewed: 03/17/2012 Elsevier Interactive Patient Education  2017 [employer].

## 2017-06-12 ENCOUNTER — Encounter: Payer: Self-pay | Admitting: Advanced Practice Midwife

## 2017-06-12 DIAGNOSIS — O283 Abnormal ultrasonic finding on antenatal screening of mother: Secondary | ICD-10-CM | POA: Insufficient documentation

## 2017-06-20 ENCOUNTER — Encounter: Payer: Self-pay | Admitting: Advanced Practice Midwife

## 2017-06-27 ENCOUNTER — Ambulatory Visit (INDEPENDENT_AMBULATORY_CARE_PROVIDER_SITE_OTHER): Payer: Medicaid Other | Admitting: Obstetrics & Gynecology

## 2017-06-27 VITALS — BP 108/65 | HR 94 | Wt 204.1 lb

## 2017-06-27 DIAGNOSIS — O283 Abnormal ultrasonic finding on antenatal screening of mother: Secondary | ICD-10-CM

## 2017-06-27 DIAGNOSIS — Z3483 Encounter for supervision of other normal pregnancy, third trimester: Secondary | ICD-10-CM

## 2017-06-27 DIAGNOSIS — Z2804 Immunization not carried out because of patient allergy to vaccine or component: Secondary | ICD-10-CM

## 2017-06-27 NOTE — Progress Notes (Signed)
   PRENATAL VISIT NOTE  Subjective:  Samantha Myers is a 32 y.o. G3P2002 at [redacted]w[redacted]d being seen today for ongoing prenatal care.  She is currently monitored for the following issues for this low-risk pregnancy and has Supervision of normal pregnancy in third trimester; Immunization not carried out because of allergy to vaccine or component; and Abnormal fetal ultrasound on their problem list.  Patient reports no complaints.  Contractions: Irritability. Vag. Bleeding: None.  Movement: Present. Denies leaking of fluid.   The following portions of the patient's history were reviewed and updated as appropriate: allergies, current medications, past family history, past medical history, past social history, past surgical history and problem list. Problem list updated.  Objective:   Vitals:   06/27/17 0906  BP: 108/65  Pulse: 94  Weight: 204 lb 1.6 oz (92.6 kg)    Fetal Status: Fetal Heart Rate (bpm): 147   Movement: Present     General:  Alert, oriented and cooperative. Patient is in no acute distress.  Skin: Skin is warm and dry. No rash noted.   Cardiovascular: Normal heart rate noted  Respiratory: Normal respiratory effort, no problems with respiration noted  Abdomen: Soft, gravid, appropriate for gestational age.  Pain/Pressure: Absent     Pelvic: Cervical exam deferred        Extremities: Normal range of motion.  Edema: Trace  Mental Status: Normal mood and affect. Normal behavior. Normal judgment and thought content.   Assessment and Plan:  Pregnancy: G3P2002 at [redacted]w[redacted]d  1. Encounter for supervision of other normal pregnancy in third trimester  2. Immunization not carried out because of allergy to vaccine or component  3. Abnormal fetal ultrasound Repeat shows appropriate growth  Preterm labor symptoms and general obstetric precautions including but not limited to vaginal bleeding, contractions, leaking of fluid and fetal movement were reviewed in detail with the patient. Please  refer to After Visit Summary for other counseling recommendations.  Return in about 2 weeks (around 07/11/2017).  No future appointments.  Willodean Rosenthal, MD

## 2017-07-11 ENCOUNTER — Encounter: Payer: Self-pay | Admitting: Obstetrics and Gynecology

## 2017-07-11 ENCOUNTER — Ambulatory Visit (INDEPENDENT_AMBULATORY_CARE_PROVIDER_SITE_OTHER): Payer: Medicaid Other | Admitting: Obstetrics and Gynecology

## 2017-07-11 VITALS — BP 109/61 | HR 83 | Wt 205.0 lb

## 2017-07-11 DIAGNOSIS — Z3483 Encounter for supervision of other normal pregnancy, third trimester: Secondary | ICD-10-CM

## 2017-07-11 DIAGNOSIS — O283 Abnormal ultrasonic finding on antenatal screening of mother: Secondary | ICD-10-CM

## 2017-07-11 DIAGNOSIS — Z2804 Immunization not carried out because of patient allergy to vaccine or component: Secondary | ICD-10-CM

## 2017-07-11 NOTE — Progress Notes (Signed)
   PRENATAL VISIT NOTE  Subjective:  Samantha Myers is a 32 y.o. G3P2002 at 7442w6d being seen today for ongoing prenatal care.  She is currently monitored for the following issues for this low-risk pregnancy and has Supervision of normal pregnancy in third trimester; Immunization not carried out because of allergy to vaccine or component; and Abnormal fetal ultrasound on their problem list.  Patient reports occasional contractions.  Contractions: Irritability. Vag. Bleeding: None.  Movement: Present. Denies leaking of fluid.   The following portions of the patient's history were reviewed and updated as appropriate: allergies, current medications, past family history, past medical history, past social history, past surgical history and problem list. Problem list updated.  Objective:   Vitals:   07/11/17 0830  BP: 109/61  Pulse: 83  Weight: 205 lb (93 kg)    Fetal Status: Fetal Heart Rate (bpm): 150   Movement: Present     General:  Alert, oriented and cooperative. Patient is in no acute distress.  Skin: Skin is warm and dry. No rash noted.   Cardiovascular: Normal heart rate noted  Respiratory: Normal respiratory effort, no problems with respiration noted  Abdomen: Soft, gravid, appropriate for gestational age.  Pain/Pressure: Absent     Pelvic: Cervical exam deferred        Extremities: Normal range of motion.  Edema: Trace  Mental Status: Normal mood and affect. Normal behavior. Normal judgment and thought content.   Assessment and Plan:  Pregnancy: G3P2002 at 8642w6d  1. Immunization not carried out because of allergy to vaccine or component  2. Encounter for supervision of other normal pregnancy in third trimester Counseled regarding contraception, gave info She is undecided  3. Abnormal fetal ultrasound Last growth 37th%tile  Preterm labor symptoms and general obstetric precautions including but not limited to vaginal bleeding, contractions, leaking of fluid and fetal  movement were reviewed in detail with the patient. Please refer to After Visit Summary for other counseling recommendations.  Return in about 2 weeks (around 07/25/2017) for OB visit.  No future appointments.  Conan BowensKelly M Tru Leopard, MD

## 2017-07-11 NOTE — Patient Instructions (Signed)
Contraception Choices Contraception, also called birth control, refers to methods or devices that prevent pregnancy. Hormonal methods Contraceptive implant A contraceptive implant is a thin, plastic tube that contains a hormone. It is inserted into the upper part of the arm. It can remain in place for up to 3 years. Progestin-only injections Progestin-only injections are injections of progestin, a synthetic form of the hormone progesterone. They are given every 3 months by a health care provider. Birth control pills Birth control pills are pills that contain hormones that prevent pregnancy. They must be taken once a day, preferably at the same time each day. Birth control patch The birth control patch contains hormones that prevent pregnancy. It is placed on the skin and must be changed once a week for three weeks and removed on the fourth week. A prescription is needed to use this method of contraception. Vaginal ring A vaginal ring contains hormones that prevent pregnancy. It is placed in the vagina for three weeks and removed on the fourth week. After that, the process is repeated with a new ring. A prescription is needed to use this method of contraception. Emergency contraceptive Emergency contraceptives prevent pregnancy after unprotected sex. They come in pill form and can be taken up to 5 days after sex. They work best the sooner they are taken after having sex. Most emergency contraceptives are available without a prescription. This method should not be used as your only form of birth control. Barrier methods Female condom A female condom is a thin sheath that is worn over the penis during sex. Condoms keep sperm from going inside a woman's body. They can be used with a spermicide to increase their effectiveness. They should be disposed after a single use. Female condom A female condom is a soft, loose-fitting sheath that is put into the vagina before sex. The condom keeps sperm from going  inside a woman's body. They should be disposed after a single use.  Intrauterine contraception Intrauterine device (IUD) An IUD is a T-shaped device that is put in a woman's uterus. There are two types:  Hormone IUD.This type contains progestin, a synthetic form of the hormone progesterone. This type can stay in place for 3-5 years.  Copper IUD.This type is wrapped in copper wire. It can stay in place for 10 years.  Permanent methods of contraception Female tubal ligation In this method, a woman's fallopian tubes are sealed, tied, or blocked during surgery to prevent eggs from traveling to the uterus.  Female sterilization This is a procedure to tie off the tubes that carry sperm (vasectomy). After the procedure, the man can still ejaculate fluid (semen).  Summary  Contraception, also called birth control, means methods or devices that prevent pregnancy.  Hormonal methods of contraception include implants, injections, pills, patches, vaginal rings, and emergency contraceptives.  Barrier methods of contraception can include female condoms, female condoms, diaphragms, cervical caps, sponges, and spermicides.  There are two types of IUDs (intrauterine devices). An IUD can be put in a woman's uterus to prevent pregnancy for 3-5 years.  Permanent sterilization can be done through a procedure for males, females, or both. This information is not intended to replace advice given to you by your health care provider. Make sure you discuss any questions you have with your health care provider. Document Released: 01/14/2005 Document Revised: 02/17/2016 Document Reviewed: 02/17/2016 Elsevier Interactive Patient Education  2018 Elsevier Inc.  Etonogestrel implant What is this medicine? ETONOGESTREL (et oh noe JES trel) is a contraceptive (birth   control) device. It is used to prevent pregnancy. It can be used for up to 3 years. This medicine may be used for other purposes; ask your health care  provider or pharmacist if you have questions. COMMON BRAND NAME(S): Implanon, Nexplanon What should I tell my health care provider before I take this medicine? They need to know if you have any of these conditions: -abnormal vaginal bleeding -blood vessel disease or blood clots -cancer of the breast, cervix, or liver -depression -diabetes -gallbladder disease -headaches -heart disease or recent heart attack -high blood pressure -high cholesterol -kidney disease -liver disease -renal disease -seizures -tobacco smoker -an unusual or allergic reaction to etonogestrel, other hormones, anesthetics or antiseptics, medicines, foods, dyes, or preservatives -pregnant or trying to get pregnant -breast-feeding How should I use this medicine? This device is inserted just under the skin on the inner side of your upper arm by a health care professional. Talk to your pediatrician regarding the use of this medicine in children. Special care may be needed. Overdosage: If you think you have taken too much of this medicine contact a poison control center or emergency room at once. NOTE: This medicine is only for you. Do not share this medicine with others. What if I miss a dose? This does not apply. What may interact with this medicine? Do not take this medicine with any of the following medications: -amprenavir -bosentan -fosamprenavir This medicine may also interact with the following medications: -barbiturate medicines for inducing sleep or treating seizures -certain medicines for fungal infections like ketoconazole and itraconazole -grapefruit juice -griseofulvin -medicines to treat seizures like carbamazepine, felbamate, oxcarbazepine, phenytoin, topiramate -modafinil -phenylbutazone -rifampin -rufinamide -some medicines to treat HIV infection like atazanavir, indinavir, lopinavir, nelfinavir, tipranavir, ritonavir -St. John's wort This list may not describe all possible interactions.  Give your health care provider a list of all the medicines, herbs, non-prescription drugs, or dietary supplements you use. Also tell them if you smoke, drink alcohol, or use illegal drugs. Some items may interact with your medicine. What should I watch for while using this medicine? This product does not protect you against HIV infection (AIDS) or other sexually transmitted diseases. You should be able to feel the implant by pressing your fingertips over the skin where it was inserted. Contact your doctor if you cannot feel the implant, and use a non-hormonal birth control method (such as condoms) until your doctor confirms that the implant is in place. If you feel that the implant may have broken or become bent while in your arm, contact your healthcare provider. What side effects may I notice from receiving this medicine? Side effects that you should report to your doctor or health care professional as soon as possible: -allergic reactions like skin rash, itching or hives, swelling of the face, lips, or tongue -breast lumps -changes in emotions or moods -depressed mood -heavy or prolonged menstrual bleeding -pain, irritation, swelling, or bruising at the insertion site -scar at site of insertion -signs of infection at the insertion site such as fever, and skin redness, pain or discharge -signs of pregnancy -signs and symptoms of a blood clot such as breathing problems; changes in vision; chest pain; severe, sudden headache; pain, swelling, warmth in the leg; trouble speaking; sudden numbness or weakness of the face, arm or leg -signs and symptoms of liver injury like dark yellow or brown urine; general ill feeling or flu-like symptoms; light-colored stools; loss of appetite; nausea; right upper belly pain; unusually weak or tired; yellowing of the   eyes or skin -unusual vaginal bleeding, discharge -signs and symptoms of a stroke like changes in vision; confusion; trouble speaking or understanding;  severe headaches; sudden numbness or weakness of the face, arm or leg; trouble walking; dizziness; loss of balance or coordination Side effects that usually do not require medical attention (report to your doctor or health care professional if they continue or are bothersome): -acne -back pain -breast pain -changes in weight -dizziness -general ill feeling or flu-like symptoms -headache -irregular menstrual bleeding -nausea -sore throat -vaginal irritation or inflammation This list may not describe all possible side effects. Call your doctor for medical advice about side effects. You may report side effects to FDA at 1-800-FDA-1088. Where should I keep my medicine? This drug is given in a hospital or clinic and will not be stored at home. NOTE: This sheet is a summary. It may not cover all possible information. If you have questions about this medicine, talk to your doctor, pharmacist, or health care provider.  2018 Elsevier/Gold Standard (2015-08-03 11:19:22) Intrauterine Device Information An intrauterine device (IUD) is inserted into your uterus to prevent pregnancy. There are two types of IUDs available:  Copper IUD-This type of IUD is wrapped in copper wire and is placed inside the uterus. Copper makes the uterus and fallopian tubes produce a fluid that kills sperm. The copper IUD can stay in place for 10 years.  Hormone IUD-This type of IUD contains the hormone progestin (synthetic progesterone). The hormone thickens the cervical mucus and prevents sperm from entering the uterus. It also thins the uterine lining to prevent implantation of a fertilized egg. The hormone can weaken or kill the sperm that get into the uterus. One type of hormone IUD can stay in place for 5 years, and another type can stay in place for 3 years.  Your health care provider will make sure you are a good candidate for a contraceptive IUD. Discuss with your health care provider the possible side  effects. Advantages of an intrauterine device  IUDs are highly effective, reversible, long acting, and low maintenance.  There are no estrogen-related side effects.  An IUD can be used when breastfeeding.  IUDs are not associated with weight gain.  The copper IUD works immediately after insertion.  The hormone IUD works right away if inserted within 7 days of your period starting. You will need to use a backup method of birth control for 7 days if the hormone IUD is inserted at any other time in your cycle.  The copper IUD does not interfere with your female hormones.  The hormone IUD can make heavy menstrual periods lighter and decrease cramping.  The hormone IUD can be used for 3 or 5 years.  The copper IUD can be used for 10 years. Disadvantages of an intrauterine device  The hormone IUD can be associated with irregular bleeding patterns.  The copper IUD can make your menstrual flow heavier and more painful.  You may experience cramping and vaginal bleeding after insertion. This information is not intended to replace advice given to you by your health care provider. Make sure you discuss any questions you have with your health care provider. Document Released: 12/19/2003 Document Revised: 06/22/2015 Document Reviewed: 07/05/2012 Elsevier Interactive Patient Education  2017 Elsevier Inc.  

## 2017-07-28 ENCOUNTER — Encounter: Payer: Medicaid Other | Admitting: Obstetrics & Gynecology

## 2017-08-01 ENCOUNTER — Ambulatory Visit (INDEPENDENT_AMBULATORY_CARE_PROVIDER_SITE_OTHER): Payer: Medicaid Other | Admitting: Obstetrics & Gynecology

## 2017-08-01 ENCOUNTER — Other Ambulatory Visit (HOSPITAL_COMMUNITY)
Admission: RE | Admit: 2017-08-01 | Discharge: 2017-08-01 | Disposition: A | Discharge status: Home / Self Care | Payer: Medicaid Other | Source: Ambulatory Visit | Attending: Obstetrics & Gynecology | Admitting: Obstetrics & Gynecology

## 2017-08-01 VITALS — BP 122/71 | HR 78 | Wt 214.1 lb

## 2017-08-01 DIAGNOSIS — Z3483 Encounter for supervision of other normal pregnancy, third trimester: Secondary | ICD-10-CM

## 2017-08-01 DIAGNOSIS — O321XX0 Maternal care for breech presentation, not applicable or unspecified: Secondary | ICD-10-CM

## 2017-08-01 NOTE — Progress Notes (Signed)
   PRENATAL VISIT NOTE  Subjective:  Samantha Myers is a 32 y.o. G3P2002 at 5943w6d being seen today for ongoing prenatal care.  She is currently monitored for the following issues for this low-risk pregnancy and has Supervision of normal pregnancy in third trimester and Immunization not carried out because of allergy to vaccine or component on their problem list.  Patient reports occasional contractions.  Contractions: Not present. Vag. Bleeding: None.  Movement: Present. Denies leaking of fluid.   The following portions of the patient's history were reviewed and updated as appropriate: allergies, current medications, past family history, past medical history, past social history, past surgical history and problem list. Problem list updated.  Objective:   Vitals:   08/01/17 1041 08/01/17 1042  BP: (!) 141/84 122/71  Pulse: 92 78  Weight: 214 lb 1.3 oz (97.1 kg)     Fetal Status: Fetal Heart Rate (bpm): 148 Fundal Height: 37 cm Movement: Present  Presentation: Complete Breech  General:  Alert, oriented and cooperative. Patient is in no acute distress.  Skin: Skin is warm and dry. No rash noted.   Cardiovascular: Normal heart rate noted  Respiratory: Normal respiratory effort, no problems with respiration noted  Abdomen: Soft, gravid, appropriate for gestational age.  Pain/Pressure: Absent     Pelvic: Cervical exam performed Dilation: 2 Effacement (%): 50 Station: Ballotable  Extremities: Normal range of motion.  Edema: None  Mental Status: Normal mood and affect. Normal behavior. Normal judgment and thought content.   Assessment and Plan:  Pregnancy: G3P2002 at 5843w6d  1. Complete breech presentation, single or unspecified fetus Discussed implications of breech presentation; discussed that the fetus can spontaneously turn to cephalic presentation.  Other interventions could be external cephalic version which is done in the hospital vs moxibustion vs planned cesarean delivery.  Risks/benefits of all modalities discussed in detail. Patient desires ECV; this was scheduled on 08/07/17 at 0800 . Information was given to her to review at home. Told to be NPO after midnight prior to appointment due to risk of possible urgent cesarean section.  2. Encounter for supervision of other normal pregnancy in third trimester Pelvic cultures done today - Strep Gp B NAA - Cervicovaginal ancillary only Preterm labor symptoms and general obstetric precautions including but not limited to vaginal bleeding, contractions, leaking of fluid and fetal movement were reviewed in detail with the patient. Please refer to After Visit Summary for other counseling recommendations.  Return in about 1 week (around 08/08/2017) for OB Visit.  Future Appointments  Date Time Provider Department Center  08/07/2017  8:00 AM [facility] None  08/08/2017 11:15 AM Levie HeritageStinson, Jacob J, DO [facility] None    Jaynie CollinsUgonna Auda Finfrock, MD

## 2017-08-01 NOTE — Patient Instructions (Addendum)
Version scheduled on 08/07/17 at 8 am   Breech Birth What is a breech birth? A breech birth is when a baby is born with the buttocks or the feet first. Most babies are in a head down (vertex) position when they are born. There are three types of breech babies:  When the baby's buttocks are showing first in the birth canal (vagina) with the legs straight up and the feet at the baby's head (frank breech).  When the baby's buttocks shows first with the legs bent at the knees and the feet down near the buttocks (complete breech).  When one or both of the baby's feet are down below the buttocks (footling breech).  What are the risks of a breech birth? Having a breech birth increases the risk to your baby. A breech birth may cause the following:  Umbilical cord prolapse. This is when the umbilical cord is in front of the baby before or during labor. This can cause the cord to become pinched or compressed. This can reduce the flow of blood and oxygen to the baby.  The baby getting stuck in the birth canal, which can cause injury or, rarely, death.  Injury to the nerves in the shoulder, arm, and hand (brachial plexus injury) when delivered.  Your baby being born too early (prematurely).  An increased need for a cesarean delivery.  What increases the risk of having a breech baby? It is not known what causes your baby to be breech. However, risk factors that may increase your chances of having a breech baby include the following:  The mother having had several babies already.  The mother having twins or more.  The mother having a baby with certain congenital disabilities.  The mother going into labor early.  The mother having problems with her uterus, such as a tumor.  The mother having placenta problems (placenta previa) or too much or not enough fluid surrounding the baby (amniotic fluid).  How do I know if my baby is breech? There are no symptoms for you to know that your baby is  breech. When you are close to your due date, your health care provider can tell if your baby is breech by:  An abdominal or vaginal (pelvic) exam.  An ultrasound.  Your health care provider may also be able to tell that your baby is breech if your baby's heartbeat is heard above your belly button. What can be done if my baby is breech?  Your health care provider may try to turn the baby in your uterus. This is a procedure called external cephalic version (ECV). This is done by your health care provider. He or she will place both hands on your abdomen and gently and slowly turn the baby around. It is important to know that ECV can increase your chances of suddenly going into labor. If an ECV is done, it is done toward the end of a healthy pregnancy. The baby may remain in this position or he or she may turn back to the breech position. You and your health care provider will discuss if an ECV is recommended for you and your baby. How will I delivery my baby if my baby is breech? You and your health care provider will discuss the best way to deliver your baby. If your baby is breech, it is less likely that a vaginal delivery will be recommended due to the risks. Some breech babies may be delivered safely without a cesarean, while in other cases  health care providers will recommend a cesarean delivery. This information is not intended to replace advice given to you by your health care provider. Make sure you discuss any questions you have with your health care provider. Document Released: 03/07/2006 Document Revised: 01/01/2016 Document Reviewed: 11/18/2013 Elsevier Interactive Patient Education  2017 [employer].    Return to clinic for any scheduled appointments or obstetric concerns, or go to [facility] for evaluation

## 2017-08-03 LAB — STREP GP B NAA: Strep Gp B NAA: NEGATIVE

## 2017-08-04 LAB — CERVICOVAGINAL ANCILLARY ONLY
Bacterial vaginitis: POSITIVE — AB
CHLAMYDIA, DNA PROBE: NEGATIVE
Candida vaginitis: NEGATIVE
NEISSERIA GONORRHEA: NEGATIVE
Trichomonas: NEGATIVE

## 2017-08-05 ENCOUNTER — Encounter (HOSPITAL_COMMUNITY): Payer: Self-pay | Admitting: *Deleted

## 2017-08-05 ENCOUNTER — Telehealth (HOSPITAL_COMMUNITY): Payer: Self-pay | Admitting: *Deleted

## 2017-08-05 NOTE — Telephone Encounter (Signed)
Preadmission screen  

## 2017-08-06 ENCOUNTER — Telehealth: Payer: Self-pay

## 2017-08-06 ENCOUNTER — Telehealth: Payer: Self-pay | Admitting: Obstetrics & Gynecology

## 2017-08-06 MED ORDER — METRONIDAZOLE 500 MG PO TABS: 500.0000 mg | ORAL_TABLET | Freq: Two times a day (BID) | ORAL | 0 refills | Status: DC

## 2017-08-06 NOTE — Telephone Encounter (Signed)
Returned TC to pt. She called to 'reschedule' her ECV. When I called pt to inquire she reports that she 'was not sure she wanted the version.'  After I reviewed the timing with the pt of the most successful timing for ECV, she elects for a primary c-section if she remains breech. I have sent a note to have her scheduled for a primary c-section on 7/20 if she remains breech. I have explained to pt that she could convert to vertex on her own.  All questions answered.   Chrissie Dacquisto L. Harraway-Smith, M.D., Evern CoreFACOG

## 2017-08-06 NOTE — Telephone Encounter (Signed)
-----   Message from Tereso NewcomerUgonna A Anyanwu, MD sent at 08/05/2017 11:10 AM EDT ----- Please call her in medication for BV. Thank you!

## 2017-08-06 NOTE — Telephone Encounter (Signed)
Patient called and made aware that she has bacterial vaginosis. Patient made aware that this is a normal shift in the flora of the vagina. Patient questions if this is STD and made aware that it is not.   Patient made aware that the treatment is flagyl twice a day for seven days. Patient pharmacy verified. Armandina StammerJennifer Howard RN

## 2017-08-07 ENCOUNTER — Ambulatory Visit (HOSPITAL_COMMUNITY): Admission: RE | Admit: 2017-08-07 | Payer: Medicaid Other | Source: Ambulatory Visit

## 2017-08-07 ENCOUNTER — Encounter: Payer: Self-pay | Admitting: *Deleted

## 2017-08-08 ENCOUNTER — Ambulatory Visit (INDEPENDENT_AMBULATORY_CARE_PROVIDER_SITE_OTHER): Payer: Medicaid Other | Admitting: Family Medicine

## 2017-08-08 ENCOUNTER — Encounter (HOSPITAL_COMMUNITY): Payer: Self-pay

## 2017-08-08 VITALS — BP 115/61 | HR 92 | Wt 215.0 lb

## 2017-08-08 DIAGNOSIS — O321XX0 Maternal care for breech presentation, not applicable or unspecified: Secondary | ICD-10-CM

## 2017-08-08 DIAGNOSIS — Z3483 Encounter for supervision of other normal pregnancy, third trimester: Secondary | ICD-10-CM

## 2017-08-08 NOTE — Progress Notes (Signed)
   PRENATAL VISIT NOTE  Subjective:  Samantha Myers is a 32 y.o. G3P2002 at 3281w6d being seen today for ongoing prenatal care.  She is currently monitored for the following issues for this low-risk pregnancy and has Supervision of normal pregnancy in third trimester and Immunization not carried out because of allergy to vaccine or component on their problem list.  Patient reports no complaints.  Contractions: Not present. Vag. Bleeding: None.  Movement: Present. Denies leaking of fluid.   The following portions of the patient's history were reviewed and updated as appropriate: allergies, current medications, past family history, past medical history, past social history, past surgical history and problem list. Problem list updated.  Objective:   Vitals:   08/08/17 1114  BP: 115/61  Pulse: 92  Weight: 215 lb (97.5 kg)    Fetal Status: Fetal Heart Rate (bpm): 150 Fundal Height: 39 cm Movement: Present     General:  Alert, oriented and cooperative. Patient is in no acute distress.  Skin: Skin is warm and dry. No rash noted.   Cardiovascular: Normal heart rate noted  Respiratory: Normal respiratory effort, no problems with respiration noted  Abdomen: Soft, gravid, appropriate for gestational age.  Pain/Pressure: Absent     Pelvic: Cervical exam deferred        Extremities: Normal range of motion.  Edema: Trace  Mental Status: Normal mood and affect. Normal behavior. Normal judgment and thought content.   Assessment and Plan:  Pregnancy: G3P2002 at 4181w6d  1. Encounter for supervision of other normal pregnancy in third trimester FHT and FH normal  2. Complete breech presentation, single or unspecified fetus Breech on US today. Discussed mode of delivery, ECV, cesarean section, possible breech vaginal delivery. Pt still would like cesarean delivery at this moment  Term labor symptoms and general obstetric precautions including but not limited to vaginal bleeding, contractions, leaking  of fluid and fetal movement were reviewed in detail with the patient. Please refer to After Visit Summary for other counseling recommendations.  No follow-ups on file.  Future Appointments  Date Time Provider Department Center  08/15/2017 12:45 PM [facility] PAT 5 [facility] None  09/03/2017  3:45 PM Willodean RosenthalHarraway-Smith, Carolyn, MD [facility] None    Levie HeritageJacob J Delaine Canter, DO

## 2017-08-13 ENCOUNTER — Other Ambulatory Visit: Payer: Self-pay | Admitting: Family Medicine

## 2017-08-14 ENCOUNTER — Other Ambulatory Visit: Payer: Self-pay | Admitting: Obstetrics & Gynecology

## 2017-08-15 ENCOUNTER — Encounter: Payer: Medicaid Other | Admitting: Family Medicine

## 2017-08-15 ENCOUNTER — Encounter (HOSPITAL_COMMUNITY)
Admission: RE | Admit: 2017-08-15 | Discharge: 2017-08-15 | Disposition: A | Discharge status: Home / Self Care | Payer: Medicaid Other | Source: Ambulatory Visit | Attending: Obstetrics and Gynecology | Admitting: Obstetrics and Gynecology

## 2017-08-15 LAB — CBC
HEMATOCRIT: 33.6 % — AB (ref 36.0–46.0)
Hemoglobin: 11.3 g/dL — ABNORMAL LOW (ref 12.0–15.0)
MCH: 32.2 pg (ref 26.0–34.0)
MCHC: 33.6 g/dL (ref 30.0–36.0)
MCV: 95.7 fL (ref 78.0–100.0)
Platelets: 166 10*3/uL (ref 150–400)
RBC: 3.51 MIL/uL — ABNORMAL LOW (ref 3.87–5.11)
RDW: 13.6 % (ref 11.5–15.5)
WBC: 8.6 10*3/uL (ref 4.0–10.5)

## 2017-08-15 LAB — TYPE AND SCREEN
ABO/RH(D): A POS
ANTIBODY SCREEN: NEGATIVE

## 2017-08-15 LAB — ABO/RH: ABO/RH(D): A POS

## 2017-08-15 NOTE — Patient Instructions (Signed)
Miguel AschoffWhitney N Vane  08/15/2017   Your procedure is scheduled on:  08/16/2017  [facility].  Pick up the phone at the desk and dial [phone]  Call this number if you have problems the morning of surgery:[phone]  Remember:   Do not eat food:(After Midnight) Desps de medianoche.  Do not drink clear liquids: (After Midnight) Desps de medianoche.  Take these medicines the morning of surgery with A SIP OF WATER: none   Do not wear jewelry, make-up or nail polish.  Do not wear lotions, powders, or perfumes. Do not wear deodorant.  Do not shave 48 hours prior to surgery.  Do not bring valuables to the hospital.  [facility] is not   responsible for any belongings or valuables brought to the hospital.  Contacts, dentures or bridgework may not be worn into surgery.  Leave suitcase in the car. After surgery it may be brought to your room.  For patients admitted to the hospital, checkout time is 11:00 AM the day of              discharge.    N/A   Please read over the following fact sheets that you were given:   Surgical Site Infection Prevention

## 2017-08-16 ENCOUNTER — Encounter (HOSPITAL_COMMUNITY): Payer: Self-pay | Admitting: *Deleted

## 2017-08-16 ENCOUNTER — Other Ambulatory Visit: Payer: Self-pay

## 2017-08-16 ENCOUNTER — Encounter (HOSPITAL_COMMUNITY)
Admission: RE | Disposition: A | Discharge status: Home / Self Care | Payer: Self-pay | Source: Home / Self Care | Attending: Obstetrics and Gynecology

## 2017-08-16 ENCOUNTER — Inpatient Hospital Stay (HOSPITAL_COMMUNITY): Payer: Medicaid Other | Admitting: Anesthesiology

## 2017-08-16 ENCOUNTER — Inpatient Hospital Stay (HOSPITAL_COMMUNITY)
Admission: RE | Admit: 2017-08-16 | Discharge: 2017-08-19 | DRG: 788 | Disposition: A | Discharge status: Home / Self Care | Payer: Medicaid Other | Attending: Obstetrics and Gynecology | Admitting: Obstetrics and Gynecology

## 2017-08-16 DIAGNOSIS — O329XX0 Maternal care for malpresentation of fetus, unspecified, not applicable or unspecified: Secondary | ICD-10-CM | POA: Diagnosis present

## 2017-08-16 DIAGNOSIS — Z98891 History of uterine scar from previous surgery: Secondary | ICD-10-CM

## 2017-08-16 DIAGNOSIS — O321XX0 Maternal care for breech presentation, not applicable or unspecified: Secondary | ICD-10-CM | POA: Diagnosis present

## 2017-08-16 DIAGNOSIS — Z3A39 39 weeks gestation of pregnancy: Secondary | ICD-10-CM

## 2017-08-16 LAB — RPR: RPR Ser Ql: NONREACTIVE

## 2017-08-16 SURGERY — Surgical Case
Anesthesia: Spinal | Site: Abdomen | Wound class: Clean Contaminated

## 2017-08-16 MED ORDER — SCOPOLAMINE 1 MG/3DAYS TD PT72: 1.0000 | MEDICATED_PATCH | Freq: Once | TRANSDERMAL | Status: DC

## 2017-08-16 MED ORDER — ONDANSETRON HCL 4 MG/2ML IJ SOLN: 4.0000 mg | Freq: Three times a day (TID) | INTRAMUSCULAR | Status: DC | PRN

## 2017-08-16 MED ORDER — DIPHENHYDRAMINE HCL 25 MG PO CAPS
25.0000 mg | ORAL_CAPSULE | ORAL | Status: DC | PRN
  Administered 2017-08-16: 25 mg via ORAL
  Filled 2017-08-16: qty 1

## 2017-08-16 MED ORDER — SIMETHICONE 80 MG PO CHEW
80.0000 mg | CHEWABLE_TABLET | ORAL | Status: DC
  Administered 2017-08-17 (×2): 80 mg via ORAL
  Filled 2017-08-16 (×3): qty 1

## 2017-08-16 MED ORDER — SCOPOLAMINE 1 MG/3DAYS TD PT72
MEDICATED_PATCH | TRANSDERMAL | Status: DC | PRN
  Administered 2017-08-16: 1 via TRANSDERMAL

## 2017-08-16 MED ORDER — OXYTOCIN 10 UNIT/ML IJ SOLN
INTRAMUSCULAR | Status: AC
  Filled 2017-08-16: qty 4

## 2017-08-16 MED ORDER — OXYCODONE HCL 5 MG PO TABS
5.0000 mg | ORAL_TABLET | ORAL | Status: DC | PRN
  Administered 2017-08-18: 5 mg via ORAL
  Filled 2017-08-16: qty 1

## 2017-08-16 MED ORDER — MENTHOL 3 MG MT LOZG: 1.0000 | LOZENGE | OROMUCOSAL | Status: DC | PRN

## 2017-08-16 MED ORDER — ENOXAPARIN SODIUM 40 MG/0.4ML ~~LOC~~ SOLN
40.0000 mg | SUBCUTANEOUS | Status: DC
  Filled 2017-08-16 (×2): qty 0.4

## 2017-08-16 MED ORDER — SODIUM CHLORIDE 0.9% FLUSH: 3.0000 mL | INTRAVENOUS | Status: DC | PRN

## 2017-08-16 MED ORDER — WITCH HAZEL-GLYCERIN EX PADS: 1.0000 "application " | MEDICATED_PAD | CUTANEOUS | Status: DC | PRN

## 2017-08-16 MED ORDER — NALOXONE HCL 0.4 MG/ML IJ SOLN: 0.4000 mg | INTRAMUSCULAR | Status: DC | PRN

## 2017-08-16 MED ORDER — MORPHINE SULFATE (PF) 0.5 MG/ML IJ SOLN
INTRAMUSCULAR | Status: AC
  Filled 2017-08-16: qty 10

## 2017-08-16 MED ORDER — NALBUPHINE HCL 10 MG/ML IJ SOLN: 5.0000 mg | Freq: Once | INTRAMUSCULAR | Status: DC | PRN

## 2017-08-16 MED ORDER — ONDANSETRON HCL 4 MG/2ML IJ SOLN
INTRAMUSCULAR | Status: DC | PRN
  Administered 2017-08-16: 4 mg via INTRAVENOUS

## 2017-08-16 MED ORDER — LACTATED RINGERS IV SOLN
INTRAVENOUS | Status: DC | PRN
  Administered 2017-08-16: 12:00:00 via INTRAVENOUS

## 2017-08-16 MED ORDER — OXYTOCIN 10 UNIT/ML IJ SOLN
INTRAVENOUS | Status: DC | PRN
  Administered 2017-08-16: 40 [IU] via INTRAVENOUS

## 2017-08-16 MED ORDER — NALBUPHINE HCL 10 MG/ML IJ SOLN: 5.0000 mg | INTRAMUSCULAR | Status: DC | PRN

## 2017-08-16 MED ORDER — HYDROMORPHONE HCL 1 MG/ML IJ SOLN: 0.2500 mg | INTRAMUSCULAR | Status: DC | PRN

## 2017-08-16 MED ORDER — NALOXONE HCL 4 MG/10ML IJ SOLN
1.0000 ug/kg/h | INTRAMUSCULAR | Status: DC | PRN
  Filled 2017-08-16: qty 5

## 2017-08-16 MED ORDER — FENTANYL CITRATE (PF) 100 MCG/2ML IJ SOLN
INTRAMUSCULAR | Status: AC
  Filled 2017-08-16: qty 2

## 2017-08-16 MED ORDER — DIBUCAINE 1 % RE OINT: 1.0000 "application " | TOPICAL_OINTMENT | RECTAL | Status: DC | PRN

## 2017-08-16 MED ORDER — ACETAMINOPHEN 325 MG PO TABS
650.0000 mg | ORAL_TABLET | ORAL | Status: DC | PRN
  Administered 2017-08-16 – 2017-08-19 (×3): 650 mg via ORAL
  Filled 2017-08-16 (×4): qty 2

## 2017-08-16 MED ORDER — PHENYLEPHRINE 8 MG IN D5W 100 ML (0.08MG/ML) PREMIX OPTIME
INJECTION | INTRAVENOUS | Status: DC | PRN
  Administered 2017-08-16: 60 ug/min via INTRAVENOUS

## 2017-08-16 MED ORDER — PHENYLEPHRINE 8 MG IN D5W 100 ML (0.08MG/ML) PREMIX OPTIME
INJECTION | INTRAVENOUS | Status: AC
  Filled 2017-08-16: qty 100

## 2017-08-16 MED ORDER — COCONUT OIL OIL: 1.0000 "application " | TOPICAL_OIL | Status: DC | PRN

## 2017-08-16 MED ORDER — OXYCODONE HCL 5 MG PO TABS: 5.0000 mg | ORAL_TABLET | Freq: Once | ORAL | Status: DC | PRN

## 2017-08-16 MED ORDER — FENTANYL CITRATE (PF) 100 MCG/2ML IJ SOLN
INTRAMUSCULAR | Status: DC | PRN
  Administered 2017-08-16: 10 ug via INTRATHECAL

## 2017-08-16 MED ORDER — LACTATED RINGERS IV SOLN
INTRAVENOUS | Status: DC
  Administered 2017-08-16 (×3): via INTRAVENOUS

## 2017-08-16 MED ORDER — SIMETHICONE 80 MG PO CHEW
80.0000 mg | CHEWABLE_TABLET | Freq: Three times a day (TID) | ORAL | Status: DC
  Administered 2017-08-17 – 2017-08-19 (×7): 80 mg via ORAL
  Filled 2017-08-16 (×7): qty 1

## 2017-08-16 MED ORDER — CEFAZOLIN SODIUM-DEXTROSE 2-4 GM/100ML-% IV SOLN
2.0000 g | INTRAVENOUS | Status: AC
  Administered 2017-08-16: 2 g via INTRAVENOUS

## 2017-08-16 MED ORDER — BUPIVACAINE IN DEXTROSE 0.75-8.25 % IT SOLN
INTRATHECAL | Status: DC | PRN
  Administered 2017-08-16: 1.6 mL via INTRATHECAL

## 2017-08-16 MED ORDER — OXYCODONE HCL 5 MG/5ML PO SOLN: 5.0000 mg | Freq: Once | ORAL | Status: DC | PRN

## 2017-08-16 MED ORDER — SOD CITRATE-CITRIC ACID 500-334 MG/5ML PO SOLN
ORAL | Status: AC
  Filled 2017-08-16: qty 15

## 2017-08-16 MED ORDER — LACTATED RINGERS IV SOLN
INTRAVENOUS | Status: DC
  Administered 2017-08-16: 22:00:00 via INTRAVENOUS

## 2017-08-16 MED ORDER — PRENATAL MULTIVITAMIN CH
1.0000 | ORAL_TABLET | Freq: Every day | ORAL | Status: DC
  Administered 2017-08-17 – 2017-08-19 (×3): 1 via ORAL
  Filled 2017-08-16 (×3): qty 1

## 2017-08-16 MED ORDER — SIMETHICONE 80 MG PO CHEW: 80.0000 mg | CHEWABLE_TABLET | ORAL | Status: DC | PRN

## 2017-08-16 MED ORDER — OXYCODONE HCL 5 MG PO TABS: 10.0000 mg | ORAL_TABLET | ORAL | Status: DC | PRN

## 2017-08-16 MED ORDER — MORPHINE SULFATE (PF) 0.5 MG/ML IJ SOLN
INTRAMUSCULAR | Status: DC | PRN
  Administered 2017-08-16: .2 mg via INTRATHECAL

## 2017-08-16 MED ORDER — DEXAMETHASONE SODIUM PHOSPHATE 10 MG/ML IJ SOLN
INTRAMUSCULAR | Status: AC
  Filled 2017-08-16: qty 1

## 2017-08-16 MED ORDER — KETOROLAC TROMETHAMINE 30 MG/ML IJ SOLN
INTRAMUSCULAR | Status: AC
  Filled 2017-08-16: qty 1

## 2017-08-16 MED ORDER — SODIUM CHLORIDE 0.9 % IR SOLN
Status: DC | PRN
  Administered 2017-08-16: 1

## 2017-08-16 MED ORDER — PROMETHAZINE HCL 25 MG/ML IJ SOLN: 6.2500 mg | INTRAMUSCULAR | Status: DC | PRN

## 2017-08-16 MED ORDER — ZOLPIDEM TARTRATE 5 MG PO TABS
5.0000 mg | ORAL_TABLET | Freq: Every evening | ORAL | Status: DC | PRN
Start: 2017-08-16 — End: 2017-08-19

## 2017-08-16 MED ORDER — ONDANSETRON HCL 4 MG/2ML IJ SOLN
INTRAMUSCULAR | Status: AC
Start: 2017-08-16 — End: ?
  Filled 2017-08-16: qty 2

## 2017-08-16 MED ORDER — SENNOSIDES-DOCUSATE SODIUM 8.6-50 MG PO TABS
2.0000 | ORAL_TABLET | ORAL | Status: DC
  Administered 2017-08-17 – 2017-08-18 (×3): 2 via ORAL
  Filled 2017-08-16 (×3): qty 2

## 2017-08-16 MED ORDER — OXYTOCIN 40 UNITS IN LACTATED RINGERS INFUSION - SIMPLE MED: 2.5000 [IU]/h | INTRAVENOUS | Status: AC

## 2017-08-16 MED ORDER — MEPERIDINE HCL 25 MG/ML IJ SOLN: 6.2500 mg | INTRAMUSCULAR | Status: DC | PRN

## 2017-08-16 MED ORDER — IBUPROFEN 600 MG PO TABS
600.0000 mg | ORAL_TABLET | Freq: Four times a day (QID) | ORAL | Status: DC
  Administered 2017-08-17 – 2017-08-19 (×11): 600 mg via ORAL
  Filled 2017-08-16 (×11): qty 1

## 2017-08-16 MED ORDER — KETOROLAC TROMETHAMINE 30 MG/ML IJ SOLN
30.0000 mg | Freq: Once | INTRAMUSCULAR | Status: AC | PRN
  Administered 2017-08-16: 30 mg via INTRAVENOUS

## 2017-08-16 MED ORDER — DIPHENHYDRAMINE HCL 50 MG/ML IJ SOLN: 12.5000 mg | INTRAMUSCULAR | Status: DC | PRN

## 2017-08-16 MED ORDER — SOD CITRATE-CITRIC ACID 500-334 MG/5ML PO SOLN
30.0000 mL | ORAL | Status: AC
  Administered 2017-08-16: 30 mL via ORAL

## 2017-08-16 MED ORDER — DEXAMETHASONE SODIUM PHOSPHATE 10 MG/ML IJ SOLN
INTRAMUSCULAR | Status: DC | PRN
  Administered 2017-08-16: 10 mg via INTRAVENOUS

## 2017-08-16 MED ORDER — DIPHENHYDRAMINE HCL 25 MG PO CAPS: 25.0000 mg | ORAL_CAPSULE | Freq: Four times a day (QID) | ORAL | Status: DC | PRN

## 2017-08-16 SURGICAL SUPPLY — 35 items
BENZOIN TINCTURE PRP APPL 2/3 (GAUZE/BANDAGES/DRESSINGS) ×3 IMPLANT
CANISTER SUCT 3000ML PPV (MISCELLANEOUS) ×3 IMPLANT
CHLORAPREP W/TINT 26ML (MISCELLANEOUS) ×3 IMPLANT
CLOSURE STERI STRIP 1/2 X4 (GAUZE/BANDAGES/DRESSINGS) ×2 IMPLANT
CLOSURE WOUND 1/2 X4 (GAUZE/BANDAGES/DRESSINGS) ×1
DRSG OPSITE POSTOP 4X10 (GAUZE/BANDAGES/DRESSINGS) ×3 IMPLANT
ELECT REM PT RETURN 9FT ADLT (ELECTROSURGICAL) ×3
ELECTRODE REM PT RTRN 9FT ADLT (ELECTROSURGICAL) ×1 IMPLANT
EXTRACTOR VACUUM KIWI (MISCELLANEOUS) ×3 IMPLANT
GLOVE BIOGEL PI IND STRL 7.0 (GLOVE) ×2 IMPLANT
GLOVE BIOGEL PI IND STRL 7.5 (GLOVE) ×1 IMPLANT
GLOVE BIOGEL PI INDICATOR 7.0 (GLOVE) ×4
GLOVE BIOGEL PI INDICATOR 7.5 (GLOVE) ×2
GLOVE SKINSENSE NS SZ7.0 (GLOVE) ×2
GLOVE SKINSENSE STRL SZ7.0 (GLOVE) ×1 IMPLANT
GOWN STRL REUS W/ TWL LRG LVL3 (GOWN DISPOSABLE) ×2 IMPLANT
GOWN STRL REUS W/ TWL XL LVL3 (GOWN DISPOSABLE) ×1 IMPLANT
GOWN STRL REUS W/TWL LRG LVL3 (GOWN DISPOSABLE) ×4
GOWN STRL REUS W/TWL XL LVL3 (GOWN DISPOSABLE) ×2
NS IRRIG 1000ML POUR BTL (IV SOLUTION) ×3 IMPLANT
PACK C SECTION WH (CUSTOM PROCEDURE TRAY) ×3 IMPLANT
PAD ABD 7.5X8 STRL (GAUZE/BANDAGES/DRESSINGS) ×3 IMPLANT
PAD OB MATERNITY 4.3X12.25 (PERSONAL CARE ITEMS) ×3 IMPLANT
PAD PREP 24X48 CUFFED NSTRL (MISCELLANEOUS) ×3 IMPLANT
PENCIL SMOKE EVAC W/HOLSTER (ELECTROSURGICAL) ×3 IMPLANT
SPONGE LAP 18X18 X RAY DECT (DISPOSABLE) ×3 IMPLANT
STRIP CLOSURE SKIN 1/2X4 (GAUZE/BANDAGES/DRESSINGS) ×2 IMPLANT
SUT MNCRL 0 VIOLET CTX 36 (SUTURE) ×2 IMPLANT
SUT MON AB 4-0 PS1 27 (SUTURE) ×3 IMPLANT
SUT MONOCRYL 0 CTX 36 (SUTURE) ×4
SUT PLAIN 2 0 XLH (SUTURE) ×6 IMPLANT
SUT VIC AB 0 CT1 36 (SUTURE) ×6 IMPLANT
SUT VIC AB 3-0 CT1 27 (SUTURE) ×2
SUT VIC AB 3-0 CT1 TAPERPNT 27 (SUTURE) ×1 IMPLANT
TOWEL OR 17X24 6PK STRL BLUE (TOWEL DISPOSABLE) ×6 IMPLANT

## 2017-08-16 NOTE — Op Note (Signed)
Cesarean Section Operative Report  Samantha Myers  PROCEDURE DATE: 08/16/2017  PREOPERATIVE DIAGNOSES: Intrauterine pregnancy at [redacted]w[redacted]d weeks gestation; Breech presentation  POSTOPERATIVE DIAGNOSES: The same  PROCEDURE: Primary Low Transverse Cesarean Section  SURGEON:   Surgeon(s) and Role:    * [place] Bing, MD - Primary    * Karston Hyland, Kandra Nicolas, MD - Fellow  ASSISTANT:  Nolene Ebbs, MD - OB Fellow   INDICATIONS: Samantha Myers is a 32 y.o. [phone] at [redacted]w[redacted]d here for cesarean section secondary to the indications listed under preoperative diagnoses; please see preoperative note for further details.  The risks of cesarean section were discussed with the patient including but were not limited to: bleeding which may require transfusion or reoperation; infection which may require antibiotics; injury to bowel, bladder, ureters or other surrounding organs; injury to the fetus; need for additional procedures including hysterectomy in the event of a life-threatening hemorrhage; placental abnormalities wth subsequent pregnancies, incisional problems, thromboembolic phenomenon and other postoperative/anesthesia complications.   The patient concurred with the proposed plan, giving informed written consent for the procedure.    FINDINGS:  Viable female infant in complete breech presentation.  Apgars 8 and 9.  Clear amniotic fluid.  Intact placenta, three vessel cord.  Normal uterus, fallopian tubes and ovaries bilaterally.  ANESTHESIA: Spinal INTRAVENOUS FLUIDS: 2500 mL ESTIMATED BLOOD LOSS: 664 mL URINE OUTPUT:  225 ml SPECIMENS: Placenta sent to L&D COMPLICATIONS: None immediate  PROCEDURE IN DETAIL:  The patient was taken to the operating room where anesthesia was administered and normal fetal heart tones were confirmed. She was then prepped and draped in the normal fashion in the dorsal supine position with a leftward tilt.  After a time out was performed, a pfannensteil  skin incision was  made with the scalpel and carried through to the underlying layer of fascia. The fascia was then incised at the midline and this incision was extended laterally with the mayo scissors. Attention was turned to the superior aspect of the fascial incision which was grasped with the kocher clamps x 2, tented up and the rectus muscles were dissected off with the Bovi. In a similar fashion the inferior aspect of the fascial incision was grasped with the kocher clamps, tented up and the rectus muscles dissected off with the mayo scissors. The rectus muscles were then separated in the midline and the peritoneum was entered bluntly. The bladder blade was inserted and the vesicouterine peritoneum was identified, tented up and entered with the metzenbaum scissors. This incision was extended laterally and the bladder flap was created digitally. The bladder blade was reinserted. A low transverse hysterotomy was made with the scalpel until the endometrial cavity was breached and the amniotic sac ruptured with the Allis clamp. This incision was extended bluntly and the infant was extracted in breech atraumatically. The cord was clamped x 2 and cut, and the infant was handed to the awaiting pediatricians, after delayed cord clamping was done.  The placenta was then gradually expressed from the uterus and then the uterus was exteriorized and cleared of all clots and debris. The hysterotomy was repaired with a running suture of 1-0 Monocryl. A second imbricating layer of 1-0 Monocryl suture was then placed with excellent hemostasis achieved. The uterus and adnexa were then returned to the abdomen, and the hysterotomy and all operative sites were reinspected and excellent hemostasis was noted after irrigation and suction of the abdomen with warm saline. The peritoneum was closed with a running stitch of 3-0 Vicryl.  The fascia was reapproximated with 0 Vicryl in a simple running fashion bilaterally. The subcutaneous layer was then  reapproximated with interrupted sutures of 2-0 plain gut, and the skin was then closed with 4-0 monocryl, in a subcuticular fashion.  The patient tolerated the procedure well. Sponge, lap, instrument and needle counts were correct x 3.  She was taken to the recovery room in stable condition.   Disposition: PACU - hemodynamically stable.   Maternal Condition: stable    Signed: Frederik PearJulie P Nhu Glasby, MD OB Fellow 08/16/2017 1:17 PM

## 2017-08-16 NOTE — Transfer of Care (Signed)
Immediate Anesthesia Transfer of Care Note  Patient: Samantha Myers  Procedure(s) Performed: PRIMARY CESAREAN SECTION (N/A Abdomen)  Patient Location: PACU  Anesthesia Type:Spinal  Level of Consciousness: awake, alert  and oriented  Airway & Oxygen Therapy: Patient Spontanous Breathing  Post-op Assessment: Report given to RN and Post -op Vital signs reviewed and stable  Post vital signs: Reviewed and stable  Last Vitals:  Vitals Value Taken Time  BP    Temp    Pulse 71 08/16/2017  1:13 PM  Resp    SpO2 100 % 08/16/2017  1:13 PM  Vitals shown include unvalidated device data.  Last Pain:  Vitals:   08/16/17 1007  TempSrc: Oral         Complications: No apparent anesthesia complications

## 2017-08-16 NOTE — Anesthesia Preprocedure Evaluation (Signed)
Anesthesia Evaluation  Patient identified by MRN, date of birth, ID band Patient awake    Reviewed: Allergy & Precautions, NPO status , Patient's Chart, lab work & pertinent test results  Airway Mallampati: II  TM Distance: >3 FB Neck ROM: Full    Dental no notable dental hx.    Pulmonary neg pulmonary ROS,    Pulmonary exam normal breath sounds clear to auscultation       Cardiovascular negative cardio ROS Normal cardiovascular exam Rhythm:Regular Rate:Normal     Neuro/Psych negative neurological ROS  negative psych ROS   GI/Hepatic negative GI ROS, Neg liver ROS,   Endo/Other  negative endocrine ROS  Renal/GU negative Renal ROS  negative genitourinary   Musculoskeletal negative musculoskeletal ROS (+)   Abdominal   Peds negative pediatric ROS (+)  Hematology negative hematology ROS (+)   Anesthesia Other Findings   Reproductive/Obstetrics negative OB ROS (+) Pregnancy                             Anesthesia Physical Anesthesia Plan  ASA: II  Anesthesia Plan: Spinal   Post-op Pain Management:    Induction:   PONV Risk Score and Plan: 2 and Treatment may vary due to age or medical condition  Airway Management Planned: Natural Airway  Additional Equipment:   Intra-op Plan:   Post-operative Plan:   Informed Consent: I have reviewed the patients History and Physical, chart, labs and discussed the procedure including the risks, benefits and alternatives for the proposed anesthesia with the patient or authorized representative who has indicated his/her understanding and acceptance.   Dental advisory given  Plan Discussed with: CRNA  Anesthesia Plan Comments:         Anesthesia Quick Evaluation  

## 2017-08-16 NOTE — Anesthesia Procedure Notes (Signed)
Spinal  Patient location during procedure: OB Start time: 08/16/2017 11:36 AM End time: 08/16/2017 11:41 AM Staffing Anesthesiologist: Lowella CurbMiller, Kirklin Mcduffee Ray, MD Performed: anesthesiologist  Preanesthetic Checklist Completed: patient identified, surgical consent, pre-op evaluation, timeout performed, IV checked, risks and benefits discussed and monitors and equipment checked Spinal Block Patient position: sitting Prep: Betadine and site prepped and draped Patient monitoring: heart rate, cardiac monitor, continuous pulse ox and blood pressure Approach: midline Location: L3-4 Injection technique: single-shot Needle Needle type: Pencan  Needle gauge: 24 G Needle length: 10 cm Assessment Sensory level: T4

## 2017-08-16 NOTE — H&P (Signed)
Obstetric Preoperative History and Physical  Samantha Myers is a 32 y.o. [id number] with IUP at 3817w0d presenting for scheduled cesarean section for breech presentation.  No acute concerns. Reports good fetal movement.  [facility]  with onset of care at 12 weeks Pregnancy complications or risks: Patient Active Problem List   Diagnosis Date Noted  . Immunization not carried out because of allergy to vaccine or component 05/27/2017  . Supervision of normal pregnancy in third trimester 02/10/2017   She plans to breastfeed She desires IUD for postpartum contraception.   Prenatal labs and studies: ABO, Rh: --/--/A POS, A POS Performed at [facility], [address] [place], [place] [address]  [phone](07/19 1140) Antibody: NEG (07/19 1140) Rubella: 1.00 (01/14 1122) RPR: Non Reactive (07/19 1140)  HBsAg: Negative (01/14 1122)  HIV: Non Reactive (04/30 0925)  FAO:ZHYQMVHQGBS:Negative (07/05 1104) 2-hr GTT: normal Genetic screening normal NIPS Anatomy US normal  Prenatal Transfer Tool  Maternal Diabetes: No Genetic Screening: Normal Maternal Ultrasounds/Referrals: Normal Fetal Ultrasounds or other Referrals:  None Maternal Substance Abuse:  No Significant Maternal Medications:  None Significant Maternal Lab Results: None  Past Medical History:  Diagnosis Date  . Medical history non-contributory     Past Surgical History:  Procedure Laterality Date  . NO PAST SURGERIES      OB History  Gravida Para Term Preterm AB Living  3 2 2  0 0 2  SAB TAB Ectopic Multiple Live Births  0 0 0 0 2    # Outcome Date GA Lbr Len/2nd Weight Sex Delivery Anes PTL Lv  3 Current           2 Term 10/29/05    F Vag-Spont   LIV  1 Term 03/29/04    M Vag-Spont   LIV    Obstetric Comments  Last child in 2007. Largest prior 8.5 lbs    Social History   Socioeconomic History  . Marital status: Significant Other    Spouse name: Not on file  . Number of children: Not on file   . Years of education: Not on file  . Highest education level: Not on file  Occupational History  . Not on file  Social Needs  . Financial resource strain: Not on file  . Food insecurity:    Worry: Not on file    Inability: Not on file  . Transportation needs:    Medical: Not on file    Non-medical: Not on file  Tobacco Use  . Smoking status: Never Smoker  . Smokeless tobacco: Never Used  . Tobacco comment: tried cigarettes a long time ago, never smoked as a habit  Substance and Sexual Activity  . Alcohol use: No    Frequency: Never  . Drug use: No  . Sexual activity: Yes    Partners: Male    Birth control/protection: None  Lifestyle  . Physical activity:    Days per week: Not on file    Minutes per session: Not on file  . Stress: Not on file  Relationships  . Social connections:    Talks on phone: Not on file    Gets together: Not on file    Attends religious service: Not on file    Active member of club or organization: Not on file    Attends meetings of clubs or organizations: Not on file    Relationship status: Not on file  Other Topics Concern  . Not on file  Social History Narrative  . Not on file    Family History  Problem Relation Age of Onset  . Cancer Maternal Grandmother        breast  . Diabetes Maternal Grandfather   . Hypertension Paternal Grandmother   . Hypertension Paternal Grandfather   . Stroke Neg Hx     Medications Prior to Admission  Medication Sig Dispense Refill Last Dose  . metroNIDAZOLE (FLAGYL) 500 MG tablet Take 500 mg by mouth 2 (two) times daily.   08/15/2017 at Unknown time  . Prenatal MV-Min-FA-Omega-3 (PRENATAL GUMMIES/DHA & FA) 0.4-32.5 MG CHEW Chew 2 tablets by mouth daily.    08/15/2017 at Unknown time    Allergies  Allergen Reactions  . Pertussis Vaccines Anaphylaxis    Review of Systems: Negative except for what is mentioned in HPI.  Physical Exam: BP 132/75   Pulse (!) 102   Temp 98.2 F (36.8 C) (Oral)   Resp  16   Ht 5\' 8"  (1.727 m)   Wt 212 lb 3.2 oz (96.3 kg)   LMP 11/25/2016 (Exact Date)   BMI 32.26 kg/m  FHR by Doppler: 130 bpm CONSTITUTIONAL: Well-developed, well-nourished female in no acute distress.  HENT:  Normocephalic, atraumatic. Oropharynx is clear and moist EYES: Conjunctivae and EOM are normal. No scleral icterus.  NECK: Normal range of motion, supple SKIN: Skin is warm and dry. No rash noted. Not diaphoretic. No erythema. NEUROLGIC: Alert and oriented to person, place, and time. Normal muscle tone coordination. No cranial nerve deficit noted. PSYCHIATRIC: Normal mood and affect. Normal behavior. CARDIOVASCULAR: Normal heart rate noted, regular rhythm RESPIRATORY: Effort and breath sounds normal, no problems with respiration noted ABDOMEN: Soft, nontender, nondistended, gravid. PELVIC: Deferred MUSCULOSKELETAL: Normal range of motion. No edema and no tenderness. 2+ distal pulses.   Pertinent Labs/Studies:   Results for orders placed or performed during the hospital encounter of 08/15/17 (from the past 72 hour(s))  CBC     Status: Abnormal   Collection Time: 08/15/17 11:40 AM  Result Value Ref Range   WBC 8.6 4.0 - 10.5 K/uL   RBC 3.51 (L) 3.87 - 5.11 MIL/uL   Hemoglobin 11.3 (L) 12.0 - 15.0 g/dL   HCT 95.6 (L) 21.3 - 08.6 %   MCV 95.7 78.0 - 100.0 fL   MCH 32.2 26.0 - 34.0 pg   MCHC 33.6 30.0 - 36.0 g/dL   RDW 57.8 46.9 - 62.9 %   Platelets 166 150 - 400 K/uL    Comment: Performed at [facility], [address], [place], [place] [address]  RPR     Status: None   Collection Time: 08/15/17 11:40 AM  Result Value Ref Range   RPR Ser Ql Non Reactive Non Reactive    Comment: (NOTE) Performed At: [employer] [address] [place], [place] [id number] Jolene Schimke MD OZ:[id number]   Type and screen [facility] [place]     Status: None   Collection Time: 08/15/17 11:40 AM  Result Value Ref Range   ABO/RH(D) A POS    Antibody Screen NEG     Sample Expiration      08/18/2017 Performed at [facility], [address], [place], [place] [address]   ABO/Rh     Status: None   Collection Time: 08/15/17 11:40 AM  Result Value Ref Range   ABO/RH(D)      A POS Performed at [facility], [address], [place], [place] [address]     Assessment and Plan :  AKEYLAH HENDEL is a 32 y.o. G3P2002 at [redacted]w[redacted]d being admitted for scheduled cesarean section d/t breech presentation. The risks of cesarean section discussed with the patient included but were not limited to: bleeding which may require transfusion or reoperation; infection which may require antibiotics; injury to bowel, bladder, ureters or other surrounding organs; injury to the fetus; need for additional procedures including hysterectomy in the event of a life-threatening hemorrhage; placental abnormalities wth subsequent pregnancies, incisional problems, thromboembolic phenomenon and other postoperative/anesthesia complications. The patient concurred with the proposed plan, giving informed written consent for the procedure. Patient has been NPO since last night she will remain NPO for procedure. Anesthesia and OR aware. Preoperative prophylactic antibiotics and SCDs ordered on call to the OR. To OR when ready.    Raynelle Fanning P. Ronny Ruddell, MD OB Fellow Faculty Practice, [facility]

## 2017-08-16 NOTE — Anesthesia Postprocedure Evaluation (Signed)
Anesthesia Post Note  Patient: Samantha Myers  Procedure(s) Performed: PRIMARY CESAREAN SECTION (N/A Abdomen)     Patient location during evaluation: PACU Anesthesia Type: Spinal Level of consciousness: oriented and awake and alert Pain management: pain level controlled Vital Signs Assessment: post-procedure vital signs reviewed and stable Respiratory status: spontaneous breathing and respiratory function stable Cardiovascular status: blood pressure returned to baseline and stable Postop Assessment: no headache, no backache and no apparent nausea or vomiting Anesthetic complications: no    Last Vitals:  Vitals:   08/16/17 1430 08/16/17 1457  BP: 118/65 118/71  Pulse: 72 70  Resp: 20 16  Temp:  36.9 C  SpO2: 100% 100%    Last Pain:  Vitals:   08/16/17 1457  TempSrc: Oral  PainSc: 0-No pain   Pain Goal:                 Lowella CurbWarren Ray Inessa Wardrop

## 2017-08-17 LAB — CBC
HEMATOCRIT: 29.3 % — AB (ref 36.0–46.0)
HEMOGLOBIN: 9.9 g/dL — AB (ref 12.0–15.0)
MCH: 32.4 pg (ref 26.0–34.0)
MCHC: 33.8 g/dL (ref 30.0–36.0)
MCV: 95.8 fL (ref 78.0–100.0)
Platelets: 152 10*3/uL (ref 150–400)
RBC: 3.06 MIL/uL — AB (ref 3.87–5.11)
RDW: 13.7 % (ref 11.5–15.5)
WBC: 12.8 10*3/uL — ABNORMAL HIGH (ref 4.0–10.5)

## 2017-08-17 LAB — CREATININE, SERUM
Creatinine, Ser: 0.63 mg/dL (ref 0.44–1.00)
GFR calc Af Amer: >60 mL/min (ref >60)
GFR calc non Af Amer: >60 mL/min (ref >60)

## 2017-08-17 NOTE — Progress Notes (Signed)
Subjective: Postpartum Day 1: Cesarean Delivery Patient reports incisional pain and tolerating PO.    Objective: Vital signs in last 24 hours: Temp:  [97.7 F (36.5 C)-98.7 F (37.1 C)] 98.6 F (37 C) (07/21 0948) Pulse Rate:  [52-102] 62 (07/21 0948) Resp:  [12-21] 18 (07/21 0948) BP: (102-132)/(47-92) 109/62 (07/21 0948) SpO2:  [95 %-100 %] 100 % (07/21 0948) Weight:  [212 lb 3.2 oz (96.3 kg)] 212 lb 3.2 oz (96.3 kg) (07/20 1007)  Physical Exam:  General: alert, cooperative and no distress Lochia: appropriate Uterine Fundus: firm Incision: no significant drainage, no dehiscence DVT Evaluation: No evidence of DVT seen on physical exam.  Recent Labs    08/15/17 1140 08/17/17 0625  HGB 11.3* 9.9*  HCT 33.6* 29.3*    Assessment/Plan: Status post Cesarean section. Doing well postoperatively.  Continue current care.  Wynelle BourgeoisMarie Williams 08/17/2017, 10:03 AM

## 2017-08-17 NOTE — Addendum Note (Signed)
Addendum  created 08/17/17 0803 by Rica Recordsickelton, Layson Bertsch, CRNA   Sign clinical note

## 2017-08-17 NOTE — Lactation Note (Signed)
This note was copied from a baby's chart. Lactation Consultation Note  Patient Name: Samantha Myers'UToday's Date: 08/17/2017 Reason for consult: Infant < 6lbs;Initial assessment;Infant weight loss   P3, SGA infant 5 lbs 14.7 ounces, 33 hours, weight loss -3%, c/s delivery  Mom has prior BF experience, BF eldest child (son) 6 months and her (daughter) 1 month stopped due to  low milk supply and  high weight loss w/daughter.  [facility] [place] Per mom although her feeding choice was breastfeeding she has been supplementing w/ Similac Advance w/ iron. Mom thought she did not have any milk. LC discussed  Infant's small tummy size,  taught breast massage and mom hand expressed colostrum from both breast. Mom latch infant on left breast using cross -cradle position, infant mouth open wide , lips flanged out and chin pressed into breast, infant w/ wide gape,  audible swallowing heard by LC. Infant feeding guidelines given w/ feeding amounts. STS and newborn behaviors with "Baby & Me book's Breastfeeding Basics".  Mom was pleased w/ latch and felt more confident with breastfeeding. Will feed infant according hunger cues, 8 to 12 times / 24 hours including nights. Mom plans to latch baby to breast first, then use DEBP and give infant any pumped EBM by spoon or cup feeding. Mom knows to pump q3h for 15-20 min.  LC demonstrated to parents  how to use manual breast pump and DEBP - how to disassemble, clean and reassemble parts and breast milk  storage guidelines explained. Discussed I & O. Will call LC if have any  further questions or concerns.   Maternal Data Formula Feeding for Exclusion: No Has patient been taught Hand Expression?: Yes Does the patient have breastfeeding experience prior to this delivery?: Yes  Feeding Feeding Type: Breast Fed Length of feed: 20 min(Mom still BF lC left room)  LATCH Score Latch: Grasps breast easily, tongue down, lips  flanged, rhythmical sucking.  Audible Swallowing: Spontaneous and intermittent  Type of Nipple: Everted at rest and after stimulation  Comfort (Breast/Nipple): Soft / non-tender  Hold (Positioning): Assistance needed to correctly position infant at breast and maintain latch.  LATCH Score: 9  Interventions Interventions: Assisted with latch;Breast massage;Hand express;Breast compression;Adjust position;Support pillows;Expressed milk;Hand pump;DEBP  Lactation Tools Discussed/Used Tools: Pump Breast pump type: Double-Electric Breast Pump;Manual WIC Program: Yes Pump Review: Setup, frequency, and cleaning Initiated by:: Danelle Earthlyobin Travonte Byard, IBCLC Date initiated:: 08/17/17   Consult Status Consult Status: Follow-up Date: 08/18/17 Follow-up type: In-patient    Danelle EarthlyRobin Raynette Arras 08/17/2017, 9:15 PM

## 2017-08-17 NOTE — Anesthesia Postprocedure Evaluation (Signed)
Anesthesia Post Note  Patient: Samantha Myers  Procedure(s) Performed: PRIMARY CESAREAN SECTION (N/A Abdomen)     Patient location during evaluation: Mother Baby Anesthesia Type: Spinal Level of consciousness: oriented and awake and alert Pain management: pain level controlled Vital Signs Assessment: post-procedure vital signs reviewed and stable Respiratory status: spontaneous breathing, respiratory function stable and patient connected to nasal cannula oxygen Cardiovascular status: blood pressure returned to baseline and stable Postop Assessment: no headache, no backache, no apparent nausea or vomiting and patient able to bend at knees Anesthetic complications: no    Last Vitals:  Vitals:   08/17/17 0627 08/17/17 0647  BP:  106/61  Pulse:  62  Resp:  18  Temp:  36.6 C  SpO2: 97%     Last Pain:  Vitals:   08/17/17 0647  TempSrc: Oral  PainSc:    Pain Goal:                 Rica RecordsICKELTON,Skylor Schnapp

## 2017-08-18 NOTE — Progress Notes (Signed)
Patient ID: Samantha Myers, female   DOB: 05/26/1985, 32 y.o.   MRN: [id number]  POSTPARTUM PROGRESS NOTE  Post Operative Day 2 Subjective:  Samantha Myers is a 32 y.o. [id number] 5850w0d s/p C/section for breech presentation.  No acute events overnight. Patient complains that incision site is painful today, whereas it was not painful yesterday.  Pt denies problems with ambulating, voiding or po intake.  She denies nausea or vomiting.  Pain is well controlled.  She has not had flatus. She has not had bowel movement.  Lochia Small.   Objective: Blood pressure 115/69, pulse 65, temperature 97.6 F (36.4 C), resp. rate 18, height 5\' 8"  (1.727 m), weight 96.3 kg (212 lb 3.2 oz), last menstrual period 11/25/2016, SpO2 100 %, unknown if currently breastfeeding.  Physical Exam:  General: alert, cooperative and no distress Chest: no respiratory distress Heart:regular rate, distal pulses intact Incision: no sign of infection. Minimal bleeding.  Abdomen: soft, appropriately tender,  Uterine Fundus: firm, appropriately tender DVT Evaluation: No calf swelling or tenderness  Recent Labs    08/17/17 0625  HGB 9.9*  HCT 29.3*    Assessment/Plan:  ASSESSMENT: Samantha Myers is a 32 y.o. G3P3003 3550w0d s/p c/section for breech presentation.   Plan for discharge tomorrow, Breastfeeding and Contraception IUD   LOS: 2 days   Jackelyn KnifeDaniel K OlsonMD 08/18/2017, 11:46 AM

## 2017-08-19 MED ORDER — IBUPROFEN 600 MG PO TABS: 600.0000 mg | ORAL_TABLET | Freq: Four times a day (QID) | ORAL | 0 refills | Status: AC

## 2017-08-19 MED ORDER — OXYCODONE HCL 5 MG PO TABS
5.0000 mg | ORAL_TABLET | ORAL | 0 refills | Status: AC | PRN
Start: 2017-08-19 — End: 2017-09-03

## 2017-08-19 NOTE — Lactation Note (Signed)
This note was copied from a baby's chart. Lactation Consultation Note  Patient Name: Samantha Myers Today's Date: 08/19/2017   P3, low blood sugar 42, 39 weeks, 5 lbs 13 oz.  Infant BF 15 minutes and mom hand expressed 4.975ml breast milk that was spoon feed by LC to infant. 60 hr female infant , having jitteriness,  RN talk w/ family offering about supplementing with breastfeeding due low blood sugar result. Will offer formula w/ curve tip syringe based on age due having 4.5 EBM/ 25.5 ml of formula. RN discussed formula risk. Parents w/ continue with STS, Mom BF according hunger cues, 8 to 12 times / 24 hours including nights.  infant has deep latch and latches to breast well but easily falls asleep at breast. Mom w/ continue to wake sleepy baby at breast to feed. Mom w/ pump after infant feeding w/ minium of 8 times with / 24 hours , using  hand expression /breast massage to help  while pumping. Discussed breast engorgement, prevention and treatment. LC reviewed O/P services, breastfeeding support groups, community resources, and our phone # for post-discharge questions.   Maternal Data    Feeding Feeding Type: Breast Fed Length of feed: 15 min(Mom gave 4.5 ml expressed Breastmilk on spoon)  LATCH Score Latch: Too sleepy or reluctant, no latch achieved, no sucking elicited.(Baby falls asleep at breast.)  Audible Swallowing: Spontaneous and intermittent  Type of Nipple: Everted at rest and after stimulation  Comfort (Breast/Nipple): Soft / non-tender  Hold (Positioning): No assistance needed to correctly position infant at breast.  LATCH Score: 8  Interventions    Lactation Tools Discussed/Used     Consult Status Consult Status: Follow-up Date: 08/19/17 Follow-up type: In-patient    Danelle EarthlyRobin Quinisha Mould 08/19/2017, 12:33 AM

## 2017-08-19 NOTE — Discharge Summary (Addendum)
OB Discharge Summary     Patient Name: Samantha Myers DOB: 01/14/1986 MRN: [id number]  Date of admission: 08/16/2017 Delivering MD: [place] BingPICKENS, CHARLIE   Date of discharge: 08/19/2017  Admitting diagnosis: Primary c-section breech presentation Intrauterine pregnancy: [id number]     Secondary diagnosis:  Active Problems:   Malpresentation before onset of labor   Status post primary low transverse cesarean section  Additional problems: no additional problems addressed during this admission     Discharge diagnosis: Term Pregnancy Delivered                                                                                                Post partum procedures: none  Augmentation:pt was admitted for scheduled LTCS, as baby was in complete breech position, no augmentation was utilized  Complications: None  Hospital course:  Sceduled C/S   32 y.o. yo G3P3003 at 6982w0d was admitted to the hospital 08/16/2017 for scheduled cesarean section with the following indication:Malpresentation (complete breech).  Membrane Rupture Time/Date: 12:10 PM ,08/16/2017   Patient delivered a Viable infant.08/16/2017  female Details of operation can be found in separate operative note.  Pateint had an uncomplicated postpartum course.  She is ambulating, tolerating a regular diet, passing flatus, and urinating well. PT denies shortness of breath and pre-syncopic episodes.  Pt denies nausea, vomiting, change in vision and headache.  Patient is discharged home in stable condition on  08/19/17         Physical exam  Vitals:   08/18/17 0510 08/18/17 1325 08/18/17 2233 08/19/17 0500  BP: 115/69 122/77 121/69 116/60  Pulse: 65 73 68 62  Resp: 18 16 18 18   Temp: 97.6 F (36.4 C) 99.4 F (37.4 C) 98.7 F (37.1 C) 98.5 F (36.9 C)  TempSrc:  Axillary Oral Oral  SpO2: 100%     Weight:      Height:       General: alert, cooperative and no distress Lochia: appropriate. Pt denies presence of clots and reports that  endometrial sloughing is as heavy as that as a typical menstruation cycle.  Uterine Fundus: Was unable to palpate fundus Incision: Dressing is clean, dry, and intact DVT Evaluation: No evidence of DVT seen on physical exam. Legs are not tender to palpation or warm.  Pt is mildly edematous on exam; +1 edema bilaterally   Labs: Lab Results  Component Value Date   WBC 12.8 (H) 08/17/2017   HGB 9.9 (L) 08/17/2017   HCT 29.3 (L) 08/17/2017   MCV 95.8 08/17/2017   PLT 152 08/17/2017   CMP Latest Ref Rng & Units 08/17/2017  Glucose 65 - 99 mg/dL -  BUN 6 - 20 mg/dL -  Creatinine 5.620.44 - 1.301.00 mg/dL 8.650.63  Sodium 784135 - 696145 mmol/L -  Potassium 3.5 - 5.1 mmol/L -  Chloride 101 - 111 mmol/L -  CO2 22 - 32 mmol/L -  Calcium 8.9 - 10.3 mg/dL -    Discharge instruction: per After Visit Summary and "Baby and Me Booklet"; and instructional educational handout on signs/symptoms of Pre-E  Diet: routine diet  Activity: Advance  as tolerated. Pelvic rest for 6 weeks.   Outpatient follow up:6 weeks Follow up Appt: Future Appointments  Date Time Provider Department Center  09/03/2017  3:45 PM Willodean Rosenthal, MD [facility] None   Follow up Visit:No follow-ups on file.  Postpartum contraception: IUD placement at 6 week postpartum check up.  Newborn Data: Live born female  Birth Weight: 6 lb 1.9 oz (2775 g) APGAR: 8, 8  Newborn Delivery   Birth date/time:  08/16/2017 12:11:00 Delivery type:  C-Section, Low Transverse Trial of labor:  No C-section categorization:  Primary     Baby Feeding: Breast, and pt plans to supplement with formula Disposition:home with mother   08/19/2017 Ronnie Doss, Medical Student   OB FELLOW MEDICAL STUDENT NOTE ATTESTATION  I confirm that I have verified the information documented in the medical student's note and that I have also personally performed the physical exam and all medical decision making activities.   Samantha Myers is 32 y.o. G3P3 who  had LTCS at [redacted]w[redacted]d for breech presentation. Patient had uncomplicated pregnancy and operation. Plan for f/u in 2 weeks for incision check. Plan for IUD at postpartum visit.   Marcy Siren, D.O. OB Fellow  08/19/2017, 3:59 PM

## 2017-08-19 NOTE — Progress Notes (Signed)
Patient has declined Lovenox post surgery. She was educated about risk of blood clots and medication was ordered to reduce the risk is the surgical postpartum period. Patient states she will ambulate more.

## 2017-08-19 NOTE — Lactation Note (Signed)
This note was copied from a baby's chart. Lactation Consultation Note  Patient Name: Samantha Nunzio CoryWhitney Earnshaw [id number]'s Date: 08/19/2017 Reason for consult: Follow-up assessment;Infant < 6lbs   Baby 69 hours old.  39w < 6 lbs.  Baby recently had large void and stool. Mother is breastfeeding, pumping and giving supplement after of breastmilk and formula. Discussed breast compression during feedings and pumping, paced feeding.   [facility]. Mom encouraged to feed baby 8-12 times/24 hours and with feeding cues at least q 3 hours.  Reviewed engorgement care and monitoring voids/stools. Mom has my # to call for assist w/next feeding.     Maternal Data    Feeding Feeding Type: Breast Fed Length of feed: 20 min  LATCH Score                   Interventions Interventions: DEBP;Breast compression  Lactation Tools Discussed/Used     Consult Status Consult Status: Follow-up Date: 08/20/17 Follow-up type: In-patient    Dahlia ByesBerkelhammer, Ruth [facility] 08/19/2017, 9:32 AM

## 2017-08-19 NOTE — Discharge Instructions (Signed)

## 2017-08-20 ENCOUNTER — Ambulatory Visit: Payer: Self-pay

## 2017-08-20 NOTE — Lactation Note (Signed)
This note was copied from a baby's chart. Lactation Consultation Note  Patient Name: Samantha Myers Reason for consult: Follow-up assessment;Term;Infant < 6lbs  P3 mother whose infant is now 6892 hours old.  Mother and baby will be discharged today.  Mother is breastfeeding in the cradle positioning as I arrived.  Infant feeding well and audible swallows noted.  No assistance needed for positioning or latch.  Mother feels confident in her ability to breastfeed and has no questions/concerns at this time.  Engorgement prevention/treatment explained.  Mother will continue to breastfeed and supplement as needed.  WIC referral completed yesterday by LC.     Maternal Data Formula Feeding for Exclusion: No Has patient been taught Hand Expression?: Yes Does the patient have breastfeeding experience prior to this delivery?: Yes  Feeding Feeding Type: Breast Fed Length of feed: 20 min  LATCH Score Latch: Grasps breast easily, tongue down, lips flanged, rhythmical sucking.  Audible Swallowing: Spontaneous and intermittent  Type of Nipple: Everted at rest and after stimulation  Comfort (Breast/Nipple): Soft / non-tender  Hold (Positioning): No assistance needed to correctly position infant at breast.  LATCH Score: 10  Interventions    Lactation Tools Discussed/Used Tools: Pump WIC Program: Yes   Consult Status Consult Status: Complete Date: 08/20/17 Follow-up type: Call as needed    [name] R [name], 8:55 AM

## 2017-09-03 ENCOUNTER — Ambulatory Visit (INDEPENDENT_AMBULATORY_CARE_PROVIDER_SITE_OTHER): Payer: Medicaid Other | Admitting: Obstetrics & Gynecology

## 2017-09-03 ENCOUNTER — Encounter: Payer: Self-pay | Admitting: Obstetrics & Gynecology

## 2017-09-03 VITALS — BP 114/67 | HR 80 | Ht 68.0 in | Wt 199.1 lb

## 2017-09-03 DIAGNOSIS — Z1389 Encounter for screening for other disorder: Secondary | ICD-10-CM

## 2017-09-03 DIAGNOSIS — T8149XA Infection following a procedure, other surgical site, initial encounter: Secondary | ICD-10-CM

## 2017-09-03 MED ORDER — BACITRACIN-NEOMYCIN-POLYMYXIN 400-5-5000 EX OINT: 1.0000 "application " | TOPICAL_OINTMENT | Freq: Two times a day (BID) | CUTANEOUS | 1 refills | Status: AC

## 2017-09-03 NOTE — Progress Notes (Signed)
Subjective:     Samantha Myers Meth is a 32 y.o. female who presents for a postpartum visit. She is 4 weeks postpartum following a low cervical transverse Cesarean section. I have fully reviewed the prenatal and intrapartum course. The delivery was at 39 gestational weeks. Outcome: primary cesarean section, low transverse incision. Anesthesia: spinal. Postpartum course has been uncomplicated. Baby's course has been without complications. Baby is feeding by both breast and bottle - [employer] Good Start. Bleeding no bleeding. Bowel function is normal. Bladder function is normal. Patient is not sexually active. Contraception method is desires OCPs . Postpartum depression screening: negative.  The following portions of the patient's history were reviewed and updated as appropriate: allergies, current medications, past family history, past medical history, past social history, past surgical history and problem list.  Review of Systems Pertinent items are noted in HPI.   Objective:    LMP 11/25/2016 (Exact Date)   BP 114/67   Pulse 80   Ht 5\' 8"  (1.727 m)   Wt 199 lb 1.3 oz (90.3 kg)   LMP 11/25/2016 (Exact Date)   BMI 30.27 kg/m   CONSTITUTIONAL: Well-developed, well-nourished female in no acute distress.  HENT:  Normocephalic, atraumatic EYES: Conjunctivae and EOM are normal. No scleral icterus.  NECK: Normal range of motion SKIN: Skin is warm and dry. No rash noted. Not diaphoretic.No pallor. NEUROLGIC: Alert and oriented to person, place, and time. Normal coordination.   GU: EGBUS: small sebaceus cyst on mons Vagina: no blood in vault Cervix: no lesion; no mucopurulent d/c Uterus: small, mobile Adnexa: no masses; sl tender     Assessment:    4 weeks postpartum exam. Pap smear not done at today's visit.  Done 11/20/2015   Plan:    1. Contraception: OCP (estrogen/progesterone) 2. Reviewed female hygeine shaving with pt  3. Follow up in: 1 year or as needed.    Sunshyne Horvath L.  Harraway-Smith, M.D., Evern CoreFACOG

## 2017-09-05 ENCOUNTER — Encounter: Payer: Self-pay | Admitting: Obstetrics & Gynecology

## 2017-09-10 ENCOUNTER — Ambulatory Visit: Payer: Medicaid Other

## 2019-03-01 IMAGING — US US OB < 14 WEEKS - US OB TV
1 series · 13 of 28 positions shown · non-contrast
Comparison: None.

CLINICAL DATA: Vaginal bleeding and pain

EXAM:
OBSTETRIC <14 WK US AND TRANSVAGINAL OB US
TECHNIQUE: Both transabdominal and transvaginal ultrasound examinations were
performed for complete evaluation of the gestation as well as the
maternal uterus, adnexal regions, and pelvic cul-de-sac.
Transvaginal technique was performed to assess early pregnancy.

[Series 1: us ob < 14 weeks - us ob tv · 0.22mm/px · 13 of 152 slices shown]
[im 6/152]
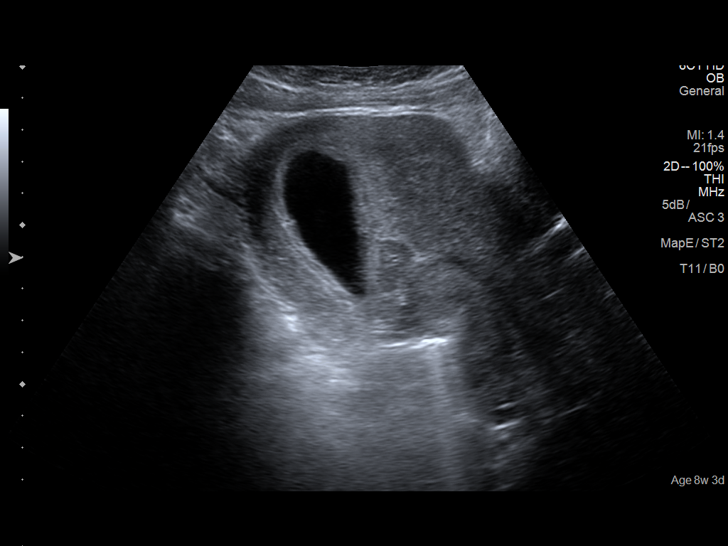
[im 17/152]
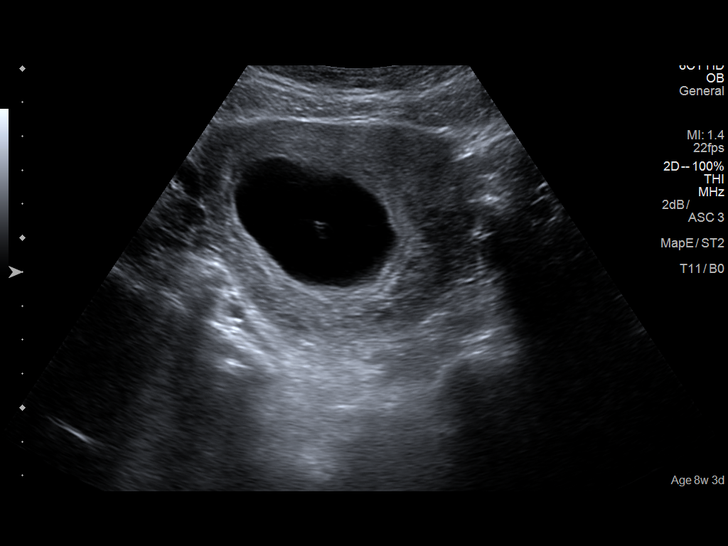
[im 28/152]
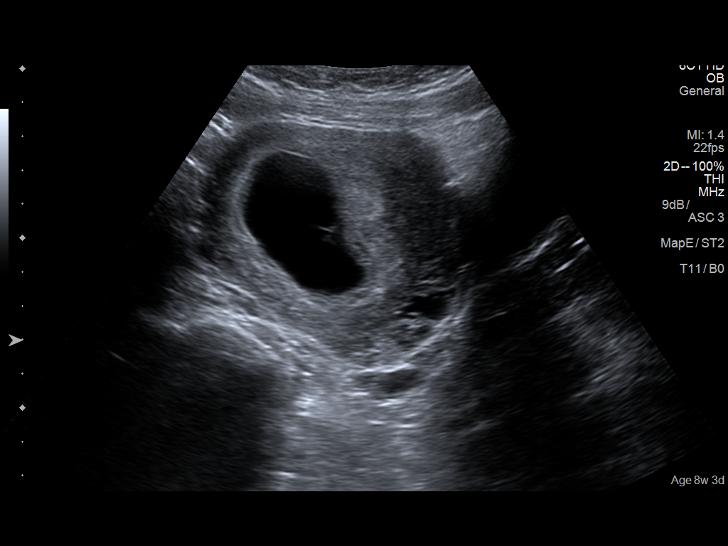
[im 40/152]
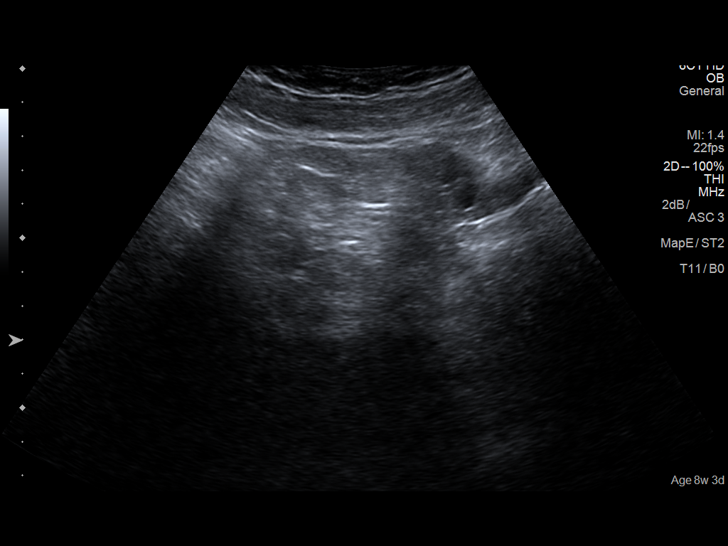
[im 51/152]
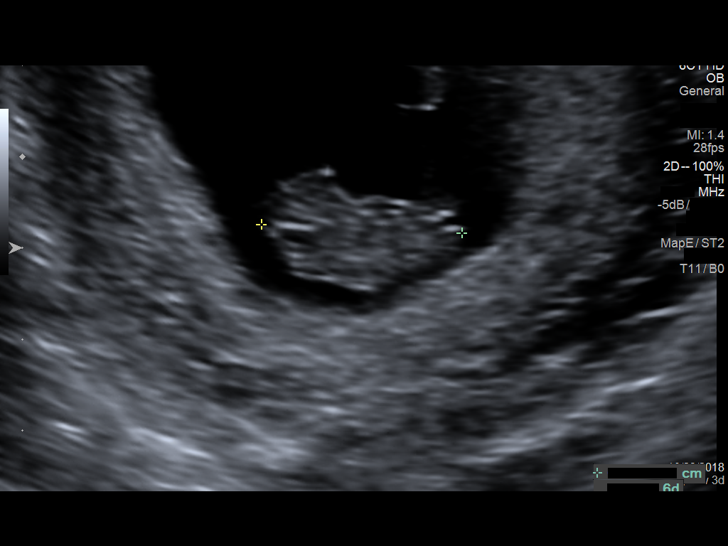
[im 62/152]
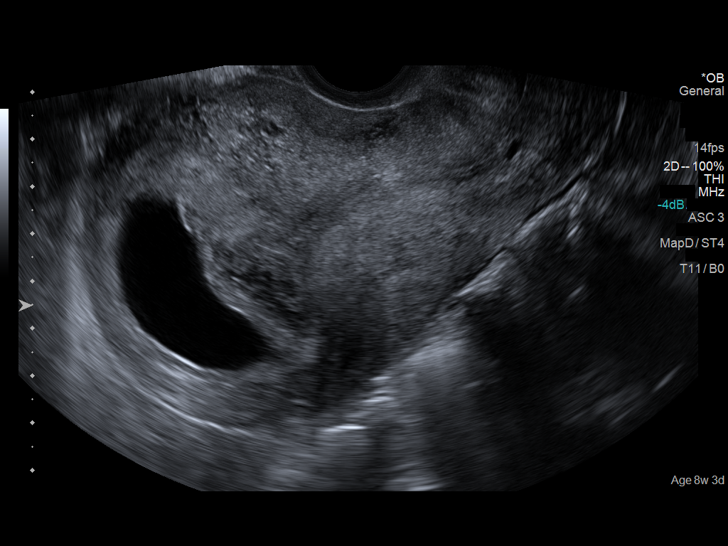
[im 79/152]
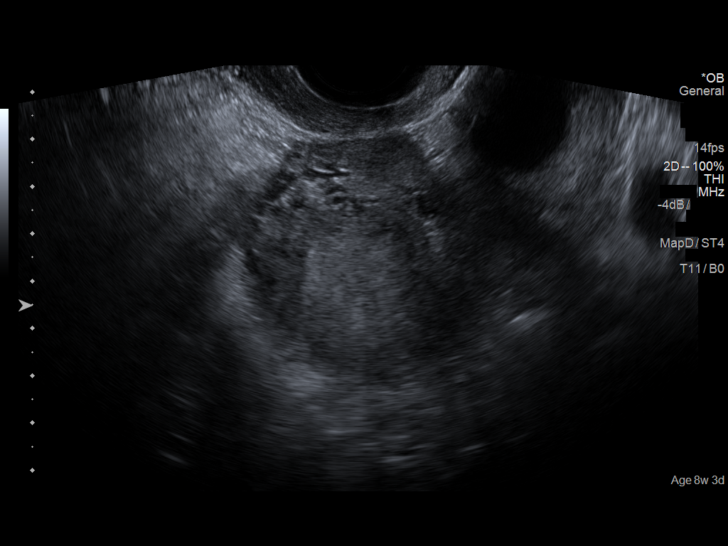
[im 90/152]
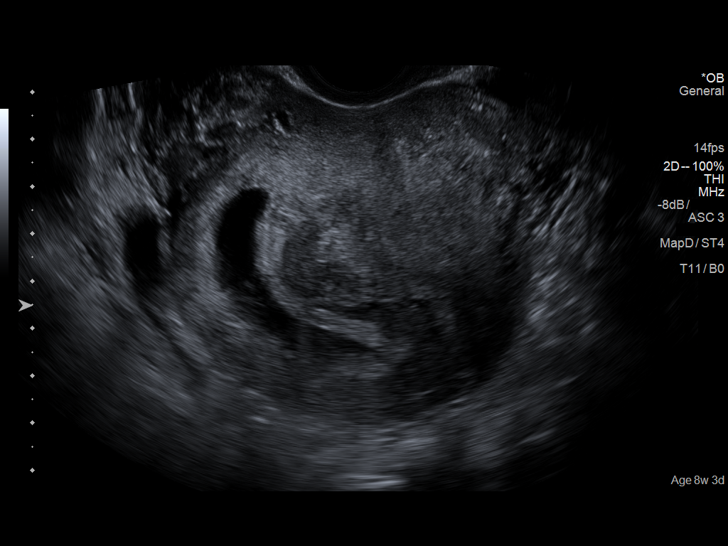
[im 101/152]
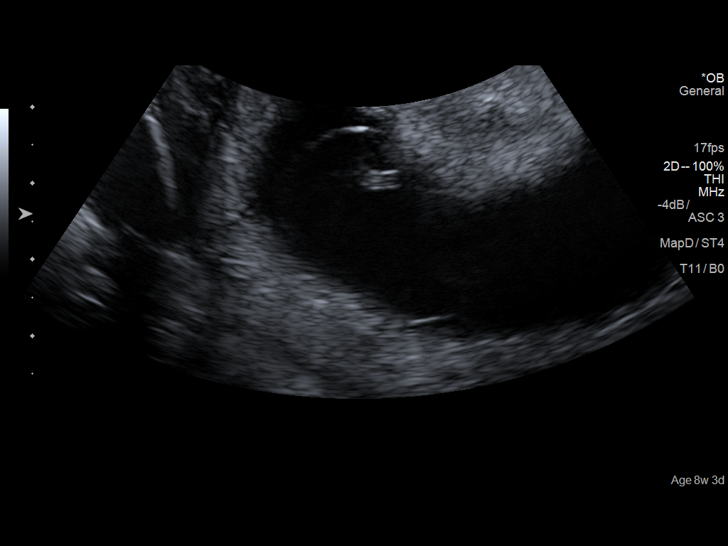
[im 112/152]
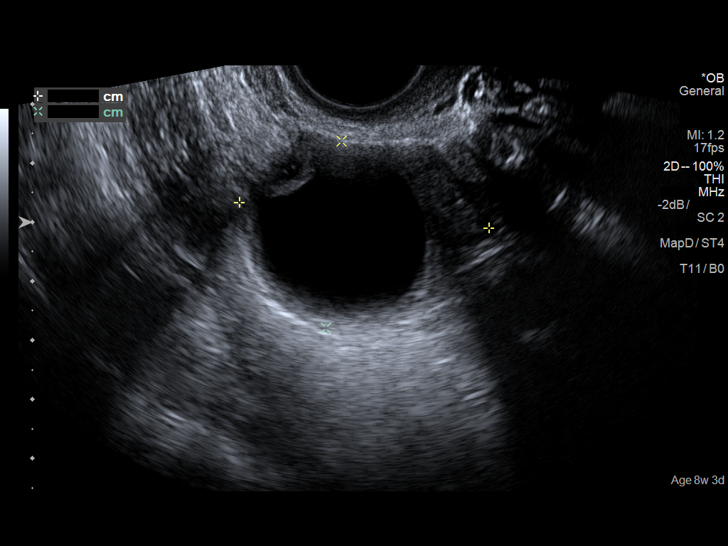
[im 124/152]
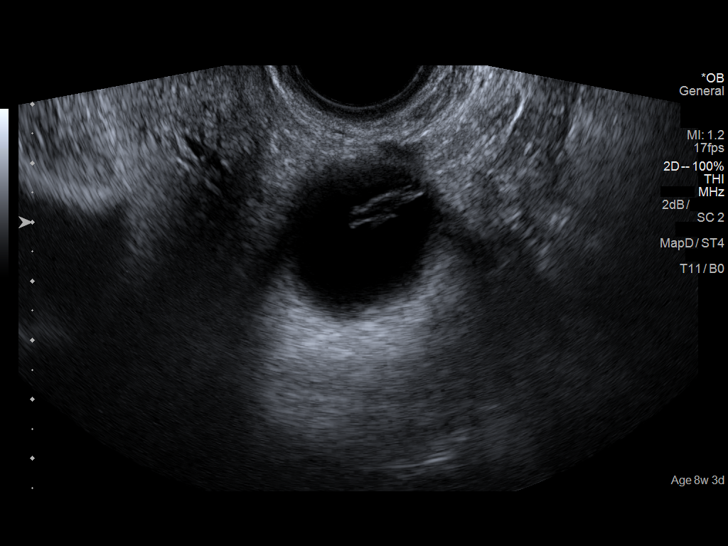
[im 135/152]
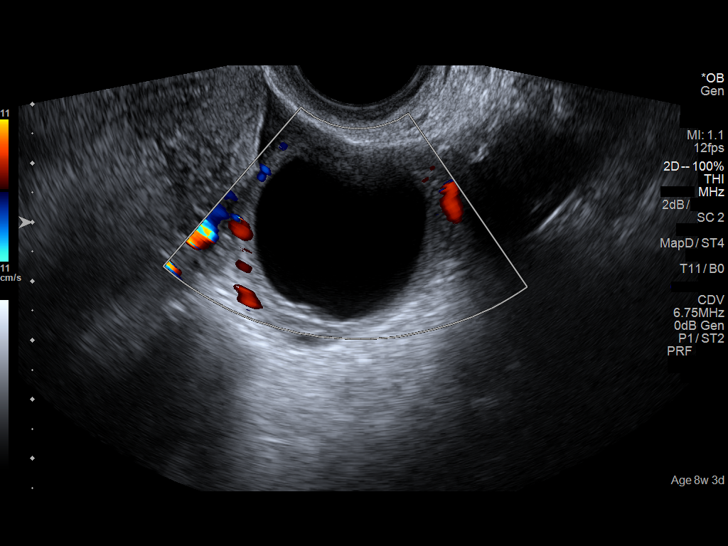
[im 146/152]
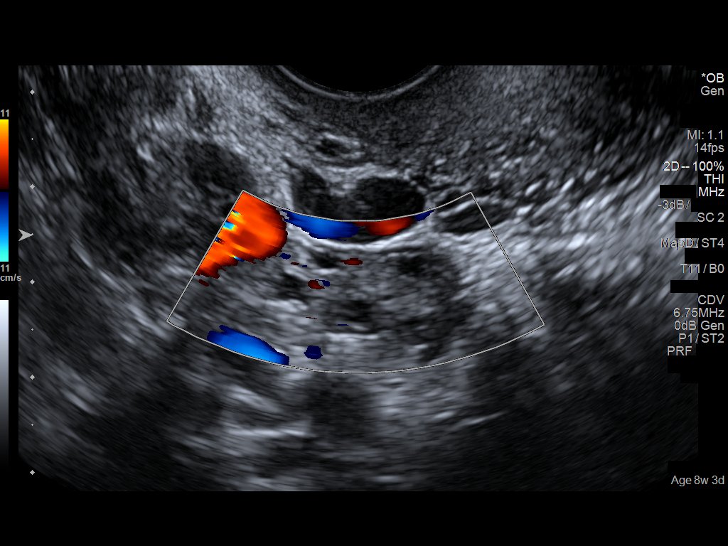

[13 of 28 positions shown; findings below may reference images not displayed]

FINDINGS: Intrauterine gestational sac: Visualized

Yolk sac:  Visualized

Embryo:  Visualized

Cardiac Activity: Visualized

Heart Rate: 175  bpm

CRL:  22  mm   8 w   6 d                  US EDC: August 28, 2017

Subchorionic hemorrhage: There is a subchorionic hemorrhage
measuring 3.0 x 2.3 cm.

Maternal uterus/adnexae: Cervical os is closed. There is a slightly
complex cyst arising from the left ovary measuring 3.3 x 3.1 x
cm. No other extrauterine pelvic mass evident. No appreciable free
pelvic fluid.
IMPRESSION: Single live intrauterine gestation with estimated gestational age of
approximately 9 weeks. Subchorionic hemorrhage measuring 3.0 x
cm evident. Cervical os closed.

Probable dominant corpus luteum arising from left ovary. No other
extrauterine pelvic mass. No appreciable free pelvic fluid.
